# Patient Record
Sex: Male | Born: 1960 | Race: White | Hispanic: No
Health system: Midwestern US, Community
[De-identification: ages and names within clinical notes are randomized; demographics above are authoritative.]

---

## 2015-07-25 NOTE — Other (Unsigned)
Patient Acct Nbr: [id number]   Primary AUTH/CERT:   Primary Insurance Company Name: [employer]  Primary Insurance Plan name: [employer]  Primary Insurance Group Number: [id number]  Primary Insurance Plan Type: Health  Primary Insurance Policy Number: [id number]

## 2015-07-30 LAB — EJECTION FRACTION PERCENTAGE
Left Ventricular Ejection Fraction High Value: 55 %
Left Ventricular Ejection Fraction: 50 %

## 2017-12-08 ENCOUNTER — Ambulatory Visit
Admit: 2017-12-08 | Discharge: 2017-12-08 | Payer: MEDICARE | Attending: Cardiovascular Disease | Primary: Family Medicine

## 2017-12-08 DIAGNOSIS — R55 Syncope and collapse: Secondary | ICD-10-CM

## 2017-12-08 NOTE — Patient Instructions (Signed)
Please call  our office at [phone] if you have questions or if you are having worsening symptoms of chest pain, pressure, or heaviness, aching, tightness or discomfort, worsening shortness of breath, palpitations, lightheadedness, or loss of consciousness. Please seek emergency care or call 911 if symptoms are severe.    Continue current medications as prescribed. If you need medication refills, please call our office during business hours (M-F 8a-4:30p)

## 2017-12-08 NOTE — Progress Notes (Signed)
[facility]  Cardiology / [facility]   OfficeNote    [place]    DATE of SERVICE: 12/08/17  TIME of SERVICE: 10:02 AM                Chief Complaint   Patient presents with   ??? Establish Cardiologist     Syncope , positive orthostatic hypotension X 3-4 years     History of Present Illness:     Joseph Cunningham is a 57 y.o. morbidly obese medically disabled male with a known history of alcohol dependence (3-4 drinks most nights of the week) who presents for evaluation of syncope.  Patient has had previous extensive cardiac evaluation including echocardiography and 2017 which is normal, serial electrocardiograms which are normal, Holter monitoring 2017 which was normal, and tilt table testing which is normal.    On presentation the patient offers no complaints of angina, failure, or palpitation.  He describes syncope which occurs at night when he gets up from bed often without prodrome occasionally with a vagal prodrome of sweating.  His symptoms have been occurring for at least 4 years and none have resulted in injury. 's exam is remarkable for obesity.  His cardiac exam is normal.  He is not orthostatic.    Past Medical History:  Past Medical History:   Diagnosis Date   ??? Alcohol dependence (HCC)    ??? Asthma    ??? Diastolic dysfunction    ??? GERD (gastroesophageal reflux disease)    ??? LONG TERM ANTICOAGULENT USE    ??? Obesity    ??? Opiate dependence (HCC)    ??? Orthostatic hypotension    ??? OSA (obstructive sleep apnea)    ??? Postoperative deep vein thrombosis (DVT) (HCC)    ??? Pulmonary embolism (HCC)    ??? Rheumatoid arthritis (HCC)    ??? Sjogren's syndrome [facility])         Past Surgical History  Past Surgical History:   Procedure Laterality Date   ??? CERVICAL DISCECTOMY  04/2011    and cervical fusion C3-4   ??? CHOLECYSTECTOMY  2003   ??? COLONOSCOPY  2008   ??? HIP ARTHROSCOPY Left 07/10/2016    with labral repair   ??? KIDNEY STONE SURGERY     ??? KNEE ARTHROSCOPY Left    ??? WISDOM TOOTH EXTRACTION        Family History  Family History   Problem Relation Age of Onset   ??? Other Mother         Margarita Mail   ??? Cancer Father         colon   ??? Coronary Art Dis Father    ??? Stroke Father    ??? Alzheimer's Disease Father    ??? Cancer Other         Prostate cancer      Social History  Social History     Tobacco Use   ??? Smoking status: Not on file   Substance Use Topics   ??? Alcohol use: Not on file   ??? Drug use: Not on file      Medications:  Current Outpatient Medications   Medication Sig Dispense Refill   ??? oxyCODONE-acetaminophen (PERCOCET) 5-325 MG per tablet Take 1 tablet by mouth every 6 hours as needed.  0   ??? amitriptyline (ELAVIL) 25 MG tablet Take 25 mg by mouth nightly as needed  0   ??? ferrous sulfate 325 (65 Fe) MG tablet Take 325 mg  by mouth daily (with breakfast)     ??? hydroxychloroquine (PLAQUENIL) 200 MG tablet Take 200 mg by mouth daily     ??? omeprazole (PRILOSEC) 20 MG delayed release capsule Take 20 mg by mouth daily  1   ??? gabapentin (NEURONTIN) 600 MG tablet Take 600 mg by mouth 3 times daily.  1   ??? cevimeline (EVOXAC) 30 MG capsule Take 30 mg by mouth 3 times daily     ??? ondansetron (ZOFRAN) 4 MG tablet Take 4 mg by mouth 2 times daily  1   ??? albuterol sulfate HFA 108 (90 Base) MCG/ACT inhaler Inhale into the lungs     ??? midodrine (PROAMATINE) 10 MG tablet Take 10 mg by mouth 2 times daily  1   ??? venlafaxine (EFFEXOR XR) 150 MG extended release capsule Take 150 mg by mouth daily  0   ??? ELIQUIS 2.5 MG TABS tablet Take 2.5 mg by mouth 2 times daily  1   ??? acetaminophen (TYLENOL) 500 MG tablet Take by mouth as needed     ??? polyethylene glycol (MIRALAX) PACK packet Take by mouth as needed     ??? vitamin C (ASCORBIC ACID) 500 MG tablet Take 500 mg by mouth daily     ??? Multiple Vitamin (MULTI VITAMIN DAILY PO) Take by mouth daily     ??? vitamin D (CHOLECALCIFEROL) 1000 UNIT TABS tablet Take 500 Units by mouth daily       No current facility-administered medications for this visit.      Allergies:  No Known  Allergies  Review of Systems:  Review of Systems   Constitutional: Positive for diaphoresis. Negative for chills and fever.   HENT: Negative for nosebleeds and trouble swallowing.    Respiratory: Negative for cough, shortness of breath and wheezing.    Cardiovascular: Positive for leg swelling (H/o DVT). Negative for chest pain and palpitations (B feet edema).   Gastrointestinal: Negative for blood in stool, nausea and vomiting. Abdominal pain: Pt states it is due to scar tissue post cholecystectomy.   Genitourinary: Negative for hematuria.   Musculoskeletal: Positive for gait problem. Negative for myalgias.   Skin: Negative for rash.   Neurological: Positive for dizziness, syncope (X 3-4 years, positive orthostatic hypotension) and numbness (B feet).   Hematological: Bruises/bleeds easily.   Psychiatric/Behavioral: Negative for dysphoric mood and suicidal ideas.     Physical Examination:  Vitals: Blood pressure 118/80, pulse 94, height 6' 1.5" (1.867 m), weight 265 lb 8 oz (120.4 kg), SpO2 99 %. Body mass index is 34.55 kg/m??.  Physical Exam   Constitutional: He is oriented to person, place, and time. He appears well-developed.   Obese   HENT:   Head: Normocephalic and atraumatic.   Mouth/Throat: No oropharyngeal exudate.   Eyes: Pupils are equal, round, and reactive to light. Conjunctivae are normal. No scleral icterus.   Neck: Normal range of motion. No JVD present. No thyromegaly present.   Cardiovascular: Normal rate, regular rhythm and normal heart sounds. Exam reveals no gallop and no friction rub.   No murmur heard.  Pulmonary/Chest: Effort normal and breath sounds normal. No stridor. No respiratory distress. He has no wheezes. He has no rales.   Abdominal: Soft. Bowel sounds are normal. He exhibits no distension. There is no hepatosplenomegaly or splenomegaly. There is no tenderness.   Musculoskeletal: Normal range of motion. He exhibits no edema or tenderness.   Neurological: He is alert and oriented to  person, place, and time. No  cranial nerve deficit.   Skin: Skin is warm and dry. No rash noted. No erythema.   Psychiatric: He has a normal mood and affect. His behavior is normal.     Cardiac Tests:    ECG:  Normal sinus rhythm and normal electrocardiogram      Lab Results   Component Value Date    LVEF 50 07/30/2015    LVEFMODE Echo 07/30/2015       Assessment and Plan:    Syncope.  Likely alcohol related.  No evidence of structural heart disease, rhythm disturbance, or autonomic dysfunction by recent testing.  I've encouraged patient to abstain from alcohol, and begin a regular aerobic exercise program.  I've asked him to recheck his primary care physician or myself if he is having difficulty, specifically any signs of withdrawal.  We will reevaluate Mr. Hege in 6 months.    Nat Christen, MD [facility]  12/08/17 10:23 AM

## 2019-02-16 ENCOUNTER — Encounter: Admit: 2019-02-16 | Discharge: 2019-02-16 | Payer: MEDICARE | Primary: Family Medicine

## 2019-02-16 DIAGNOSIS — Z1159 Encounter for screening for other viral diseases: Secondary | ICD-10-CM

## 2019-02-16 NOTE — Progress Notes (Signed)
Patient identified by name and date of birth. Patient here today for COVID-19 specimen collection.  Patient provided order for COVID-19 testing.

## 2019-02-17 LAB — COVID-19: SARS-CoV-2: DETECTED — AB

## 2019-10-10 ENCOUNTER — Inpatient Hospital Stay
Admit: 2019-10-10 | Discharge: 2019-10-11 | Disposition: A | Discharge status: Home / Self Care | Attending: Emergency Medicine

## 2019-10-10 DIAGNOSIS — R55 Syncope and collapse: Secondary | ICD-10-CM

## 2019-10-10 LAB — COMPREHENSIVE METABOLIC PANEL
ALT: 35 U/L (ref 0–49)
AST: 34 U/L (ref 15–46)
Albumin,Serum: 4.3 g/dL (ref 3.5–5.0)
Alkaline Phosphatase: 94 U/L (ref 38–126)
Anion Gap: 11 mmol/L (ref 3–13)
BUN: 20 mg/dL (ref 7–20)
CO2: 19 mmol/L — ABNORMAL LOW (ref 22–30)
Calcium: 9.2 mg/dL (ref 8.4–10.4)
Chloride: 108 mmol/L — ABNORMAL HIGH (ref 98–107)
Creatinine: 1.14 mg/dL (ref 0.52–1.25)
EGFR IF NonAfrican American: 69.1 mL/min (ref >60)
Glucose: 107 mg/dL — ABNORMAL HIGH (ref 70–100)
Potassium: 4.1 mmol/L (ref 3.5–5.1)
Sodium: 138 mmol/L (ref 135–145)
Total Bilirubin: 0.3 mg/dL (ref 0.2–1.3)
Total Protein: 8.2 g/dL (ref 6.3–8.2)
eGFR African American: 80.1 mL/min (ref >60)

## 2019-10-10 LAB — TROPONIN: Troponin I: <0.012 ng/mL (ref 0.000–0.034)

## 2019-10-10 LAB — CBC WITH AUTO DIFFERENTIAL
Absolute Baso #: 0 10*3/uL (ref 0.0–0.2)
Absolute Eos #: 0.2 10*3/uL (ref 0.0–0.5)
Absolute Lymph #: 1.6 10*3/uL (ref 1.0–4.3)
Absolute Mono #: 0.6 10*3/uL (ref 0.0–0.8)
Absolute Neut #: 3.3 10*3/uL (ref 1.8–7.0)
Basophils: 0.7 % (ref 0.0–2.0)
Eosinophils: 3.1 % (ref 1.0–6.0)
Granulocytes %: 57.7 % (ref 40.0–80.0)
Hematocrit: 35.9 % — ABNORMAL LOW (ref 40.0–52.0)
Hemoglobin: 12.1 g/dL — ABNORMAL LOW (ref 13.0–18.0)
Lymphocyte %: 28.1 % (ref 20.0–40.0)
MCH: 31.1 pg (ref 26.0–34.0)
MCHC: 33.7 % (ref 32.0–36.0)
MCV: 92.3 fL (ref 80.0–98.0)
MPV: 6.8 fL — ABNORMAL LOW (ref 7.4–10.4)
Monocytes: 10.4 % — ABNORMAL HIGH (ref 2.0–10.0)
Platelets: 172 10*3/uL (ref 140–440)
RBC: 3.89 10*6/uL — ABNORMAL LOW (ref 4.40–5.90)
RDW: 14.7 % — ABNORMAL HIGH (ref 11.5–14.5)
WBC: 5.8 10*3/uL (ref 3.6–10.7)

## 2019-10-10 MED ORDER — NORMAL SALINE FLUSH 0.9 % IV SOLN
0.9 % | Freq: Three times a day (TID) | INTRAVENOUS | Status: DC
Start: 2019-10-10 — End: 2019-10-10

## 2019-10-10 NOTE — ED Notes (Signed)
Pt returned from radiology     Olam Idler, RN  10/10/19 1626

## 2019-10-10 NOTE — Discharge Instructions (Signed)
Return if increasing pain problems or any concerns.    Ice your legs ankles and feet.    Any issues return to the emergency department.

## 2019-10-10 NOTE — ED Notes (Signed)
Pt to radiology     Olam Idler, RN  10/10/19 1626

## 2019-10-10 NOTE — ED Provider Notes (Signed)
[facility] EMERGENCY DEPT  EMERGENCY DEPARTMENT ENCOUNTER      Pt Name: Joseph Cunningham  MRN: [id number]  Birthdate 1960-04-11  Date of evaluation: 10/10/2019  Provider: Margo Aye, DO     CHIEF COMPLAINT       Chief Complaint   Patient presents with   ??? Loss of Consciousness     Pt states that he passed out Sunday @ 12am while standing at the kitchen counter.  States that this has happened in the past but no cause was found after seeing cardiologist.         HISTORY OF PRESENT ILLNESS   (Location/Symptom, Timing/Onset,Context/Setting, Quality, Duration, Modifying Factors, Severity) Note limiting factors.   HPI    Joseph Cunningham is a 59 y.o. male who presents to the emergency department complaining of pain to his lower extremities mainly his left calf and right foot.  He passed out Sunday morning when he is going to the kitchen he says he always passes out.  He is been worked up by cardiology extensively they have not found a reason for this and he presents to the emergency department he is also on Eliquis.  He did hit his head.  He says the pain is a 6 out of 10 on his left leg and foot.  He also has pain in his ankles bilaterally.  He denies any chest pain or shortness of breath.    Nursing Notes were reviewed.    REVIEW OFSYSTEMS    (2+ for level 4; 10+ level 5)   Review of Systems    10 systems reviewed and were negative except mentioned above      PAST MEDICAL HISTORY     Past Medical History:   Diagnosis Date   ??? Alcohol dependence (HCC)    ??? Asthma    ??? Diastolic dysfunction    ??? GERD (gastroesophageal reflux disease)    ??? LONG TERM ANTICOAGULENT USE    ??? Obesity    ??? Opiate dependence (HCC)    ??? Orthostatic hypotension    ??? OSA (obstructive sleep apnea)    ??? Postoperative deep vein thrombosis (DVT) (HCC)    ??? Pulmonary embolism (HCC)    ??? Rheumatoid arthritis (HCC)    ??? Sjogren's syndrome [facility])        SURGICAL HISTORY       Past Surgical History:   Procedure Laterality Date   ??? CERVICAL DISCECTOMY  04/2011     and cervical fusion C3-4   ??? CHOLECYSTECTOMY  2003   ??? COLONOSCOPY  2008   ??? HIP ARTHROSCOPY Left 07/10/2016    with labral repair   ??? KIDNEY STONE SURGERY     ??? KNEE ARTHROSCOPY Left    ??? LUMBAR DISC SURGERY  03/2014    plate, rods, screws- Dr .Hyacinth Meeker   ??? WISDOM TOOTH EXTRACTION             CURRENT MEDICATIONS       Discharge Medication List as of 10/10/2019  8:00 PM      CONTINUE these medications which have NOT CHANGED    Details   oxyCODONE-acetaminophen (PERCOCET) 5-325 MG per tablet Take 1 tablet by mouth every 6 hours as needed., R-0Historical Med      amitriptyline (ELAVIL) 25 MG tablet Take 25 mg by mouth nightly as needed, R-0Historical Med      ferrous sulfate 325 (65 Fe) MG tablet Take 325 mg by mouth daily (with breakfast)Historical Med  hydroxychloroquine (PLAQUENIL) 200 MG tablet Take 200 mg by mouth dailyHistorical Med      omeprazole (PRILOSEC) 20 MG delayed release capsule Take 20 mg by mouth daily, R-1Historical Med      gabapentin (NEURONTIN) 600 MG tablet Take 600 mg by mouth 3 times daily., R-1Historical Med      cevimeline (EVOXAC) 30 MG capsule Take 30 mg by mouth 3 times dailyHistorical Med      ondansetron (ZOFRAN) 4 MG tablet Take 4 mg by mouth 2 times daily, R-1Historical Med      albuterol sulfate HFA 108 (90 Base) MCG/ACT inhaler Inhale into the lungsHistorical Med      midodrine (PROAMATINE) 10 MG tablet Take 10 mg by mouth 2 times daily, R-1Historical Med      venlafaxine (EFFEXOR XR) 150 MG extended release capsule Take 150 mg by mouth daily, R-0Historical Med      ELIQUIS 2.5 MG TABS tablet Take 2.5 mg by mouth 2 times daily, R-1, DAWHistorical Med      acetaminophen (TYLENOL) 500 MG tablet Take by mouth as neededHistorical Med      polyethylene glycol (MIRALAX) PACK packet Take by mouth as neededHistorical Med      vitamin C (ASCORBIC ACID) 500 MG tablet Take 500 mg by mouth dailyHistorical Med      Multiple Vitamin (MULTI VITAMIN DAILY PO) Take by mouth dailyHistorical Med       vitamin D (CHOLECALCIFEROL) 1000 UNIT TABS tablet Take 500 Units by mouth dailyHistorical Med             ALLERGIES     Patient has no known allergies.    FAMILY HISTORY       Family History   Problem Relation Age of Onset   ??? Other Mother         Joseph Cunningham   ??? Cancer Father         colon   ??? Coronary Art Dis Father    ??? Stroke Father    ??? Alzheimer's Disease Father    ??? Cancer Other         Prostate cancer        SOCIAL HISTORY       Social History     Socioeconomic History   ??? Marital status: Divorced     Spouse name: None   ??? Number of children: None   ??? Years of education: None   ??? Highest education level: None   Occupational History   ??? None   Tobacco Use   ??? Smoking status: Never Smoker   ??? Smokeless tobacco: Never Used   Vaping Use   ??? Vaping Use: Never used   Substance and Sexual Activity   ??? Alcohol use: Yes     Comment: 2-3 daily   ??? Drug use: Not Currently   ??? Sexual activity: None   Other Topics Concern   ??? None   Social History Narrative   ??? None     Social Determinants of Health     Financial Resource Strain:    ??? Difficulty of Paying Living Expenses:    Food Insecurity:    ??? Worried About Programme researcher, broadcasting/film/video in the Last Year:    ??? Barista in the Last Year:    Transportation Needs:    ??? Freight forwarder (Medical):    ??? Lack of Transportation (Non-Medical):    Physical Activity:    ??? Days of Exercise per Week:    ???  Minutes of Exercise per Session:    Stress:    ??? Feeling of Stress :    Social Connections:    ??? Frequency of Communication with Friends and Family:    ??? Frequency of Social Gatherings with Friends and Family:    ??? Attends Religious Services:    ??? Database administrator or Organizations:    ??? Attends Engineer, structural:    ??? Marital Status:    Intimate Programme researcher, broadcasting/film/video Violence:    ??? Fear of Current or Ex-Partner:    ??? Emotionally Abused:    ??? Physically Abused:    ??? Sexually Abused:        SCREENINGS           PHYSICAL EXAM    (up to 7 for level 4, 8 ormore for level 5)      ED Triage Vitals [10/10/19 1501]   BP Temp Temp Source Pulse Resp SpO2 Height Weight   (!) 144/86 97.1 ??F (36.2 ??C) Infrared 103 18 98 % -- 265 lb (120.2 kg)       Physical Exam  Constitutional:       Appearance: He is well-developed.   HENT:      Head: Normocephalic and atraumatic.      Right Ear: External ear normal.      Left Ear: External ear normal.      Nose: Nose normal.   Eyes:      Conjunctiva/sclera: Conjunctivae normal.      Pupils: Pupils are equal, round, and reactive to light.   Cardiovascular:      Rate and Rhythm: Normal rate and regular rhythm.      Heart sounds: Normal heart sounds. No murmur heard.     Pulmonary:      Effort: Pulmonary effort is normal. No respiratory distress.      Breath sounds: Normal breath sounds.   Abdominal:      General: Bowel sounds are normal.      Palpations: Abdomen is soft.   Musculoskeletal:         General: Swelling, tenderness and signs of injury present. Normal range of motion.      Cervical back: Normal range of motion.      Comments: Patient has generalized tenderness on his lateral left lower leg.  He is neurovascularly intact.  He also has some generalized tenderness to his ankles bilaterally and the left foot.  He was able to ambulate.  He is neurovascular intact.  Does have some swelling ecchymosis on his lower extremities.   Skin:     General: Skin is warm and dry.      Capillary Refill: Capillary refill takes less than 2 seconds.   Neurological:      Mental Status: He is alert and oriented to person, place, and time.             DIAGNOSTIC RESULTS     EKG (Per Emergency Physician):     EKG shows a rate of 92.  Left axis deviation.  See no obvious ST or depression.  Does have Q waves in 3 and aVF.  See no obvious changes from EKG performed on 06/26/2016  RADIOLOGY (Per Emergency Physician):       Interpretation per the Radiologist below, ifavailable at the time of this note:  XR CHEST (2 VW)    Result Date: 10/10/2019  Patient Name:                GHEE,  RAMI MRN:                         [id number] Acct#:                       [id number] Diagnostic Radiology ACCESSION                 EXAM DATE/TIME           PROCEDURE                 ORDERING PROVIDER [phone]             10/10/2019 16:22 EDT       CR Chest PA & LAT         Denyse Dago CPT code [phone] Reason For Exam (CR Chest PA & LAT) fall Report CLINICAL INFORMATION:  Fall. Frontal and lateral views of the chest were obtained.  No old examinations were available for comparison at the time of dictation. No acute pulmonary disease is noted. There is no pneumothorax. The cardiovascular silhouette is within normal limits.  There are no pleural effusions. The osseous structures are intact. IMPRESSION: No acute pulmonary disease. Report Dictated on Workstation: [id number] ---** Final ---** Dictated: 10/10/2019 4:31 pm Dictating Physician: Stann Mainland Signed Date and Time: 10/10/2019 4:31 pm Signed by: Bevelyn Buckles, ANTHONY Transcribed Date and Time: 10/10/2019 4:31    XR TIBIA FIBULA LEFT (2 VIEWS)    Result Date: 10/10/2019  Patient Name:                BRYLIN, STANISLAWSKI MRN:                         [id number] Acct#:                       [id number] Diagnostic Radiology ACCESSION                 EXAM DATE/TIME           PROCEDURE                 ORDERING PROVIDER [phone]             10/10/2019 16:22 EDT       CR Tibia/Fibula 2 Views   Denyse Dago Left CPT code 09381 Reason For Exam (CR Tibia/Fibula 2 Views Left) trauma PAIN Report LEFT TIBIA AND FIBULA 2 VIEWS CLINICAL INDICATION: trauma PAIN TECHNIQUE: 2 views of the left tibia and fibula. COMPARISON: None. FINDINGS: Serpiginous, linear lucency and slight cortical irregularity noted in the proximal fibular neck. No significant displacement, angulation or apparent dislocation. Soft tissues grossly unremarkable. IMPRESSION: 1. Fracture left fibular neck. BILATERAL FOOT 3 VIEWS CLINICAL INDICATION: trauma PAIN TECHNIQUE: 3 views of the right foot. 3  views of the left foot. COMPARISON: Right ankle radiographs of same date. FINDINGS: Right foot: No acute fracture or dislocation. Probable old/healed fracture deformity and sclerotic change again noted about the distal fibula. Mild hallux valgus and some erosive change in the great toe metatarsal head medial eminence, with overlying soft tissue bunion formation. Degenerative change also in the great toe MTP joint and sesamoid articulation. Very mild plantar calcaneal spurring.  Soft tissues grossly unremarkable.  Diagnostic Radiology Report Left foot: No acute fracture or dislocation. Small, ossific convexity/excrescence off the 5th metatarsal distal-lateral metadiaphysis may represent osteochondroma or exostosis. Joint spaces maintained. Soft tissues grossly unremarkable. IMPRESSION: 1. No acute osseous abnormality. 2. Remote posttraumatic and degenerative change, as reported. Report Dictated on Workstation: [id number] ---** Final ---** Dictated: 10/10/2019 5:13 pm Dictating Physician: Georgetta Haber, MD, WENDELL Signed Date and Time: 10/10/2019 5:21 pm Signed by: Georgetta Haber, MD, [facility] Transcribed Date and Time: 10/10/2019 5:13    XR ANKLE RIGHT (MIN 3 VIEWS)    Result Date: 10/10/2019  Patient Name:                DERRELL, MILANES MRN:                         [id number] Acct#:                       [id number] Diagnostic Radiology ACCESSION                 EXAM DATE/TIME           PROCEDURE                 ORDERING PROVIDER [phone]             10/10/2019 16:22 EDT       CR Ankle 3+ Views Right   Denyse Dago CPT code 82956 Reason For Exam (CR Ankle 3+ Views Right) trauma pain Report Clinical Information: Right ankle pain and swelling following trauma. History of prior ankle fracture without surgery. Right ankle:  AP, oblique and lateral views demonstrate no evidence of acute fracture or dislocation. There is deformity of the distal fibula metaphysis consistent with a remote healed  fracture. There is ossification of the distal tibia fibula synchondrosis. There is narrowing of the lateral aspect of the tibiotalar joint with tilting of the talus and widening of the articulation between the talus and the medial malleolus which could be acute or related to the prior trauma. Well-corticated cortical ossifications of the medial talus could suggest the latter. The ankle mortise appears to be otherwise intact.  The subtalar joint is suboptimally visualized.  The other joints are well-maintained.  The base of the fifth metatarsal is in sufficiently visualized for evaluation.  There is soft tissue swelling about the ankle greatest laterally. Impression: 1. Remote posttraumatic changes of the distal fibula. 2. Narrowing of the lateral aspect of the tibiotalar joint with tilting of the talus with age-indeterminate lateral talar subluxation possibly related to remote trauma. Correlate with clinical parameters. 3. Soft tissue swelling about the ankle greatest laterally. 4. No convincing evidence of other acute bone trauma. Report Dictated on Workstation: [id number] ---** Final ---** Dictated: 10/10/2019 4:28 pm Dictating Physician: Cecille Aver MD, Herbert Deaner Signed Date and Time: 10/10/2019 4:33 pm Signed by: Cecille Aver, MD, [facility] Transcribed Date and Time: 10/10/2019 4:28    CT Head WO Contrast    Result Date: 10/10/2019  Patient Name:                BRAILEN, MACNEAL MRN:                         [id number] Acct#:                       [id number] Computed Tomography ACCESSION  EXAM DATE/TIME           PROCEDURE                 ORDERING PROVIDER [phone]             10/10/2019 16:22 EDT       CT Head or Brain w/o      Denyse Dago Contrast CPT code 90240 Reason For Exam (CT Head or Brain w/o Contrast) fall thinners Report CLINICAL INFORMATION:  Fall with hand and leg injury. Patient is on blood thinners. Unenhanced CT images of the head from skull base to vertex were obtained. Coronal and sagittal  reconstructed CT images of the head were also obtained. Postprocessing 3-D reconstructed CT images of the head were performed and simultaneously reviewed by me on a separate workstation to better evaluate for skull fracture. There were no old examinations available for comparison at the time of dictation. The ventricles and sulci are prominent compatible with atrophy. There is no evidence of hemorrhage, mass or large acute infarct.  There is no mass effect or significant shift of the midline structures.  There are no extraaxial or posterior fossa masses or fluid collections.  No significant abnormalities are noted in the region of the sella turcica. No significant abnormalities of the visualized portions of the orbits are noted. There is a tiny right-sided bony nasal septal spur. There is opacification of a couple left ethmoid air cells. The remainder of the visualized paranasal sinuses and mastoid air cells are clear. No acute displaced skull fracture is visualized. IMPRESSION: 1. No definite evidence of an acute intracranial process. 2. Mild ethmoid sinus disease. Report Dictated on Workstation: [id number] ---** Final ---** Dictated: 10/10/2019 4:31 pm Dictating Physician: Stann Mainland Signed Date and Time: 10/10/2019 4:39 pm Signed by: Bevelyn Buckles, ANTHONY Transcribed Date and Time: 10/10/2019 4:31    XR Foot Standard Bilateral    Result Date: 10/10/2019  Patient Name:                CUTLER, SUNDAY MRN:                         [id number] Acct#:                       [id number] Diagnostic Radiology ACCESSION                 EXAM DATE/TIME           PROCEDURE                 ORDERING PROVIDER [phone]             10/10/2019 16:22 EDT       CR Foot Complete 3+       Denyse Dago Views Bilateral CPT code 19417 Reason For Exam (CR Foot Complete 3+ Views Bilateral) trauma, pain Report LEFT TIBIA AND FIBULA 2 VIEWS CLINICAL INDICATION: trauma PAIN TECHNIQUE: 2 views of the left tibia and fibula. COMPARISON:  None. FINDINGS: Serpiginous, linear lucency and slight cortical irregularity noted in the proximal fibular neck. No significant displacement, angulation or apparent dislocation. Soft tissues grossly unremarkable. IMPRESSION: 1. Fracture left fibular neck. BILATERAL FOOT 3 VIEWS CLINICAL INDICATION: trauma PAIN TECHNIQUE: 3 views of the right foot. 3 views of the left foot. COMPARISON: Right ankle radiographs of same date. FINDINGS: Right foot: No acute fracture or dislocation. Probable old/healed fracture deformity and sclerotic change again noted  about the distal fibula. Mild hallux valgus and some erosive change in the great toe metatarsal head medial eminence, with overlying soft tissue bunion formation. Degenerative change also in the great toe MTP joint and sesamoid articulation. Very mild plantar calcaneal spurring.  Soft tissues grossly unremarkable.                                          Diagnostic Radiology Report Left foot: No acute fracture or dislocation. Small, ossific convexity/excrescence off the 5th metatarsal distal-lateral metadiaphysis may represent osteochondroma or exostosis. Joint spaces maintained. Soft tissues grossly unremarkable. IMPRESSION: 1. No acute osseous abnormality. 2. Remote posttraumatic and degenerative change, as reported. Report Dictated on Workstation: [id number] ---** Final ---** Dictated: 10/10/2019 5:13 pm Dictating Physician: Georgetta Haber, MD, WENDELL Signed Date and Time: 10/10/2019 5:21 pm Signed by: Georgetta Haber, MD, [facility] Transcribed Date and Time: 10/10/2019 5:13      ED BEDSIDE ULTRASOUND:   Performed by ED Physician - none    LABS:  Labs Reviewed   COMPREHENSIVE METABOLIC PANEL - Abnormal; Notable for the following components:       Result Value    Chloride 108 (*)     CO2 19 (*)     Glucose 107 (*)     All other components within normal limits    Narrative:     Test Performed by [facility], [address] [place], [place]  [address]   CBC WITH AUTO DIFFERENTIAL -  Abnormal; Notable for the following components:    RBC 3.89 (*)     Hemoglobin 12.1 (*)     Hematocrit 35.9 (*)     RDW 14.7 (*)     MPV 6.8 (*)     Monocytes 10.4 (*)     All other components within normal limits    Narrative:     Test Performed by [facility], [address] [place], [place]  [address]   TROPONIN    Narrative:     Test Performed by [facility], [address], [place], [place]  [address]        All other labs were within normal range or not returned as of this dictation.    EMERGENCY DEPARTMENT COURSE and DIFFERENTIALDIAGNOSIS/MDM:   Vitals:    Vitals:    10/10/19 1501   BP: (!) 144/86   Pulse: 103   Resp: 18   Temp: 97.1 ??F (36.2 ??C)   TempSrc: Infrared   SpO2: 98%   Weight: 120.2 kg (265 lb)       Medications   sodium chloride flush 0.9 % injection 3 mL (has no administration in time range)       MDM.    History and physical was obtained.  Patient had blood work which was unremarkable.  His x-ray showed no obvious fractures.  His CT of the brain showed no obvious bleeding.  Chest x-ray is unremarkable.  He feels well at this time.  We will discharge him to home.    REVAL:         CRITICAL CARE TIME   Total Critical Care time was     minutes, excluding separatelyreportable procedures.  There was a high probability ofclinically significant/life threatening deterioration in the patient's condition which required my urgent intervention.     CONSULTS:  None    PROCEDURES:  Unless otherwise noted below, none  Procedures    IMPRESSION      1. Syncope and collapse    2. Closed head injury, initial encounter    3. Contusion of left foot, initial encounter    4. Contusion of right ankle, initial encounter    5. Contusion of multiple sites of left lower extremity, initial encounter          DISPOSITION/PLAN   DISPOSITION        PATIENT REFERRED TO:  Jillyn LedgerLinda J Keith, DO  [address], [address]  [place] [id number]  [phone]    In 3 days        DISCHARGE MEDICATIONS:  Discharge Medication  List as of 10/10/2019  8:00 PM             (Please note:  Portions of this note were completed with a voice recognition program.  Efforts were made to edit thedictations but occasionally words and phrases are mis-transcribed.)  Form [id number]    Margo AyeWilliam E Roshaunda Starkey, DO (electronically signed)  Emergency Medicine Provider       Margo AyeWilliam E Kattia Selley, DO  10/10/19 2105

## 2020-06-15 IMAGING — DX WRIST LEFT 3 VIEWS
1 series · 3 of 3 positions shown · non-contrast
Comparison: None.

UNIT 101 
WRIST RIGHT 3 VIEWS, HAND 3 VIEWS RIGHT, HAND 3 VIEWS LEFT, WRIST LEFT 3 VIEWS, 
06/15/2020 [DATE]: 
CLINICAL INDICATION:  Rheumatoid arthritis with rheumatoid factor.

[Series 1: lateral · 0.14mm/px · 3 of 3 slices shown]
[im 1/3]
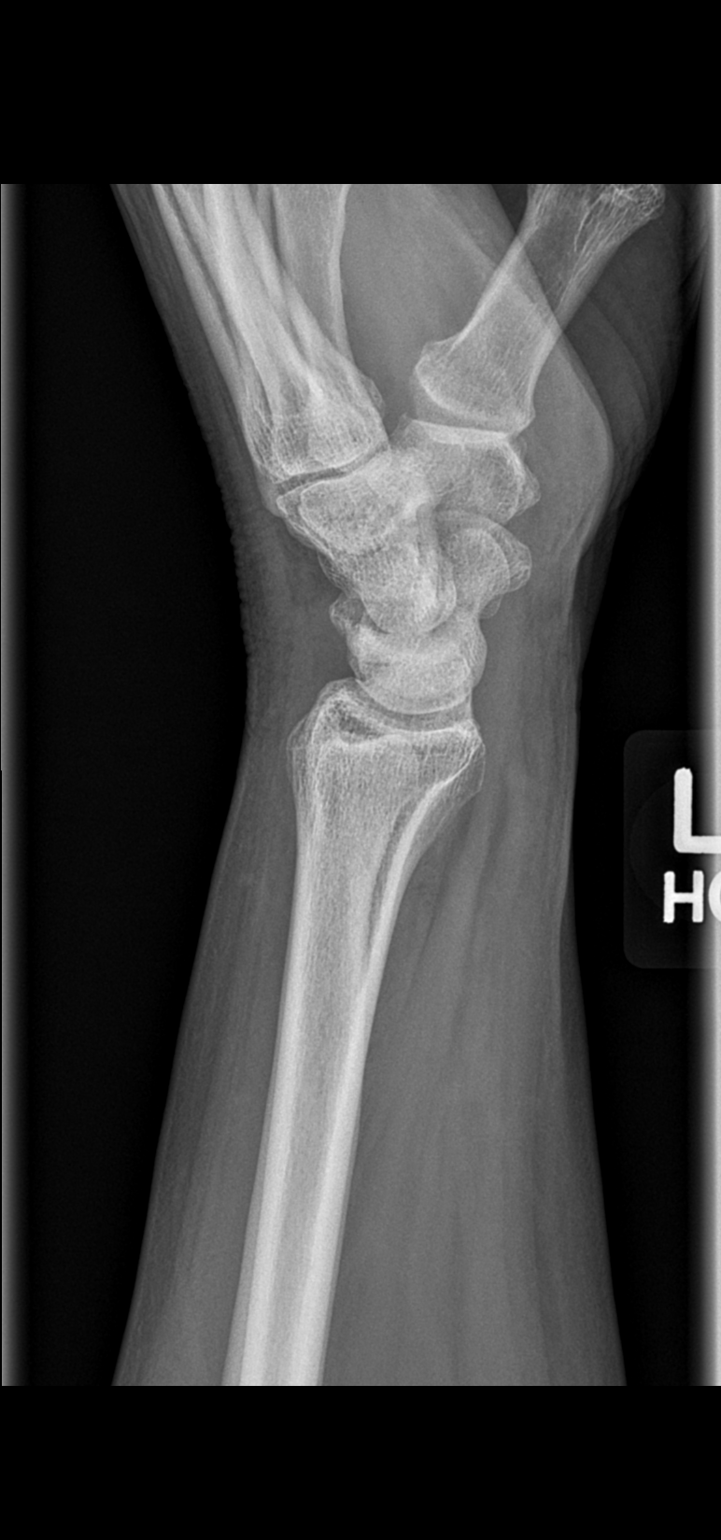
[im 2/3]
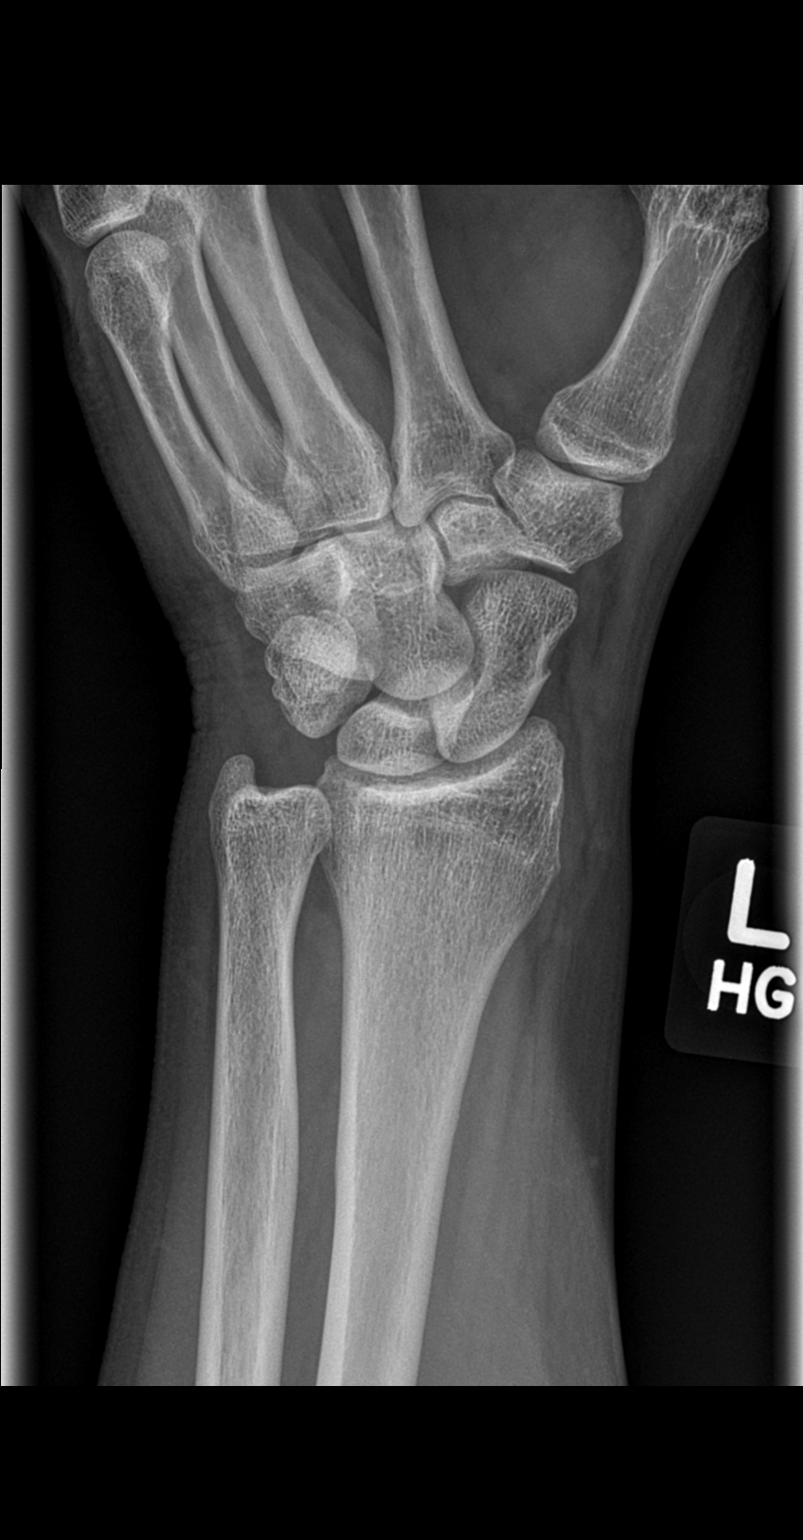
[im 3/3]
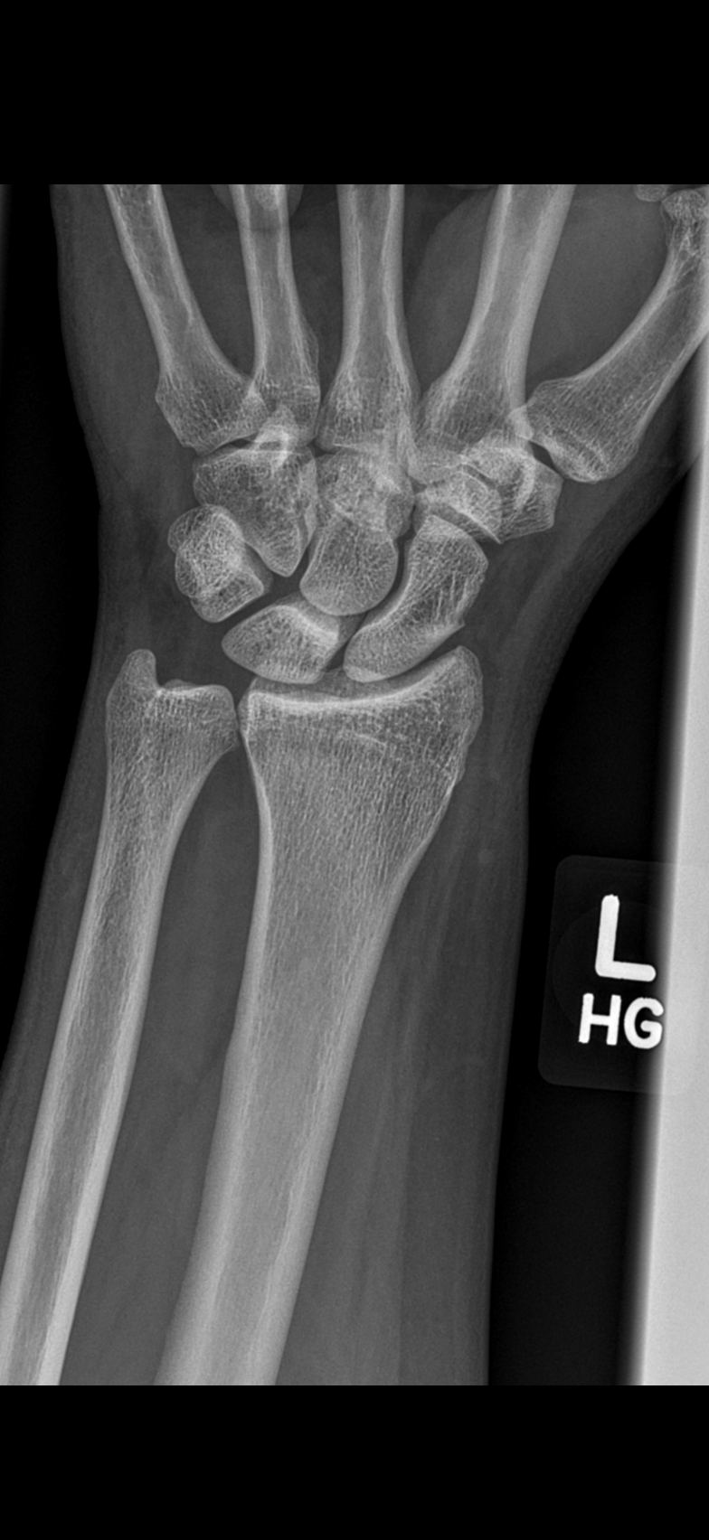

[3 of 3 positions shown; findings below may reference images not displayed]

FINDINGS: No erosions. Mild degenerative change of several interphalangeal (IP) 
joints, most marked involving the thumb. No focal soft tissue swelling.
IMPRESSION: 1.  No erosions. 
2.  Mild degenerative change.

## 2020-06-15 IMAGING — DX HAND 3 VIEWS RIGHT
1 series · 3 of 3 positions shown · non-contrast
Comparison: None.

UNIT 101 
WRIST RIGHT 3 VIEWS, HAND 3 VIEWS RIGHT, HAND 3 VIEWS LEFT, WRIST LEFT 3 VIEWS, 
06/15/2020 [DATE]: 
CLINICAL INDICATION:  Rheumatoid arthritis with rheumatoid factor.

[Series 1: AP · 0.14mm/px · 3 of 3 slices shown]
[im 1/3]
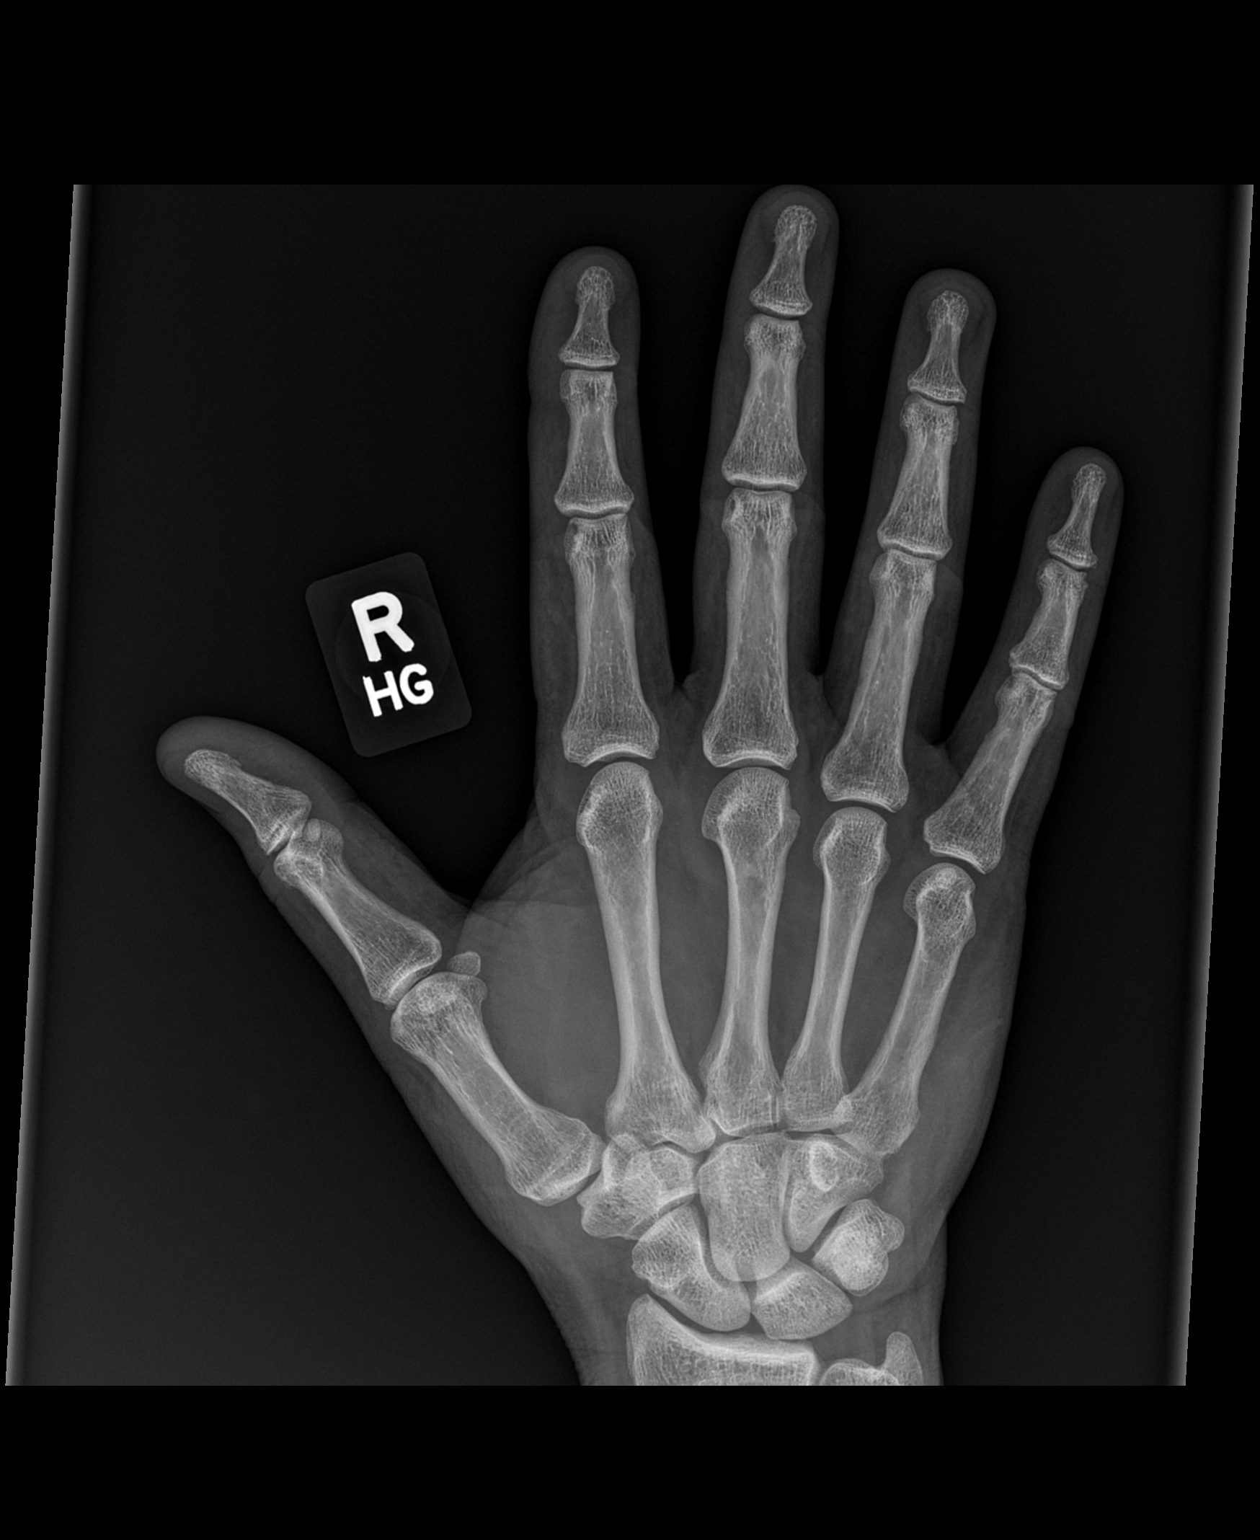
[im 2/3]
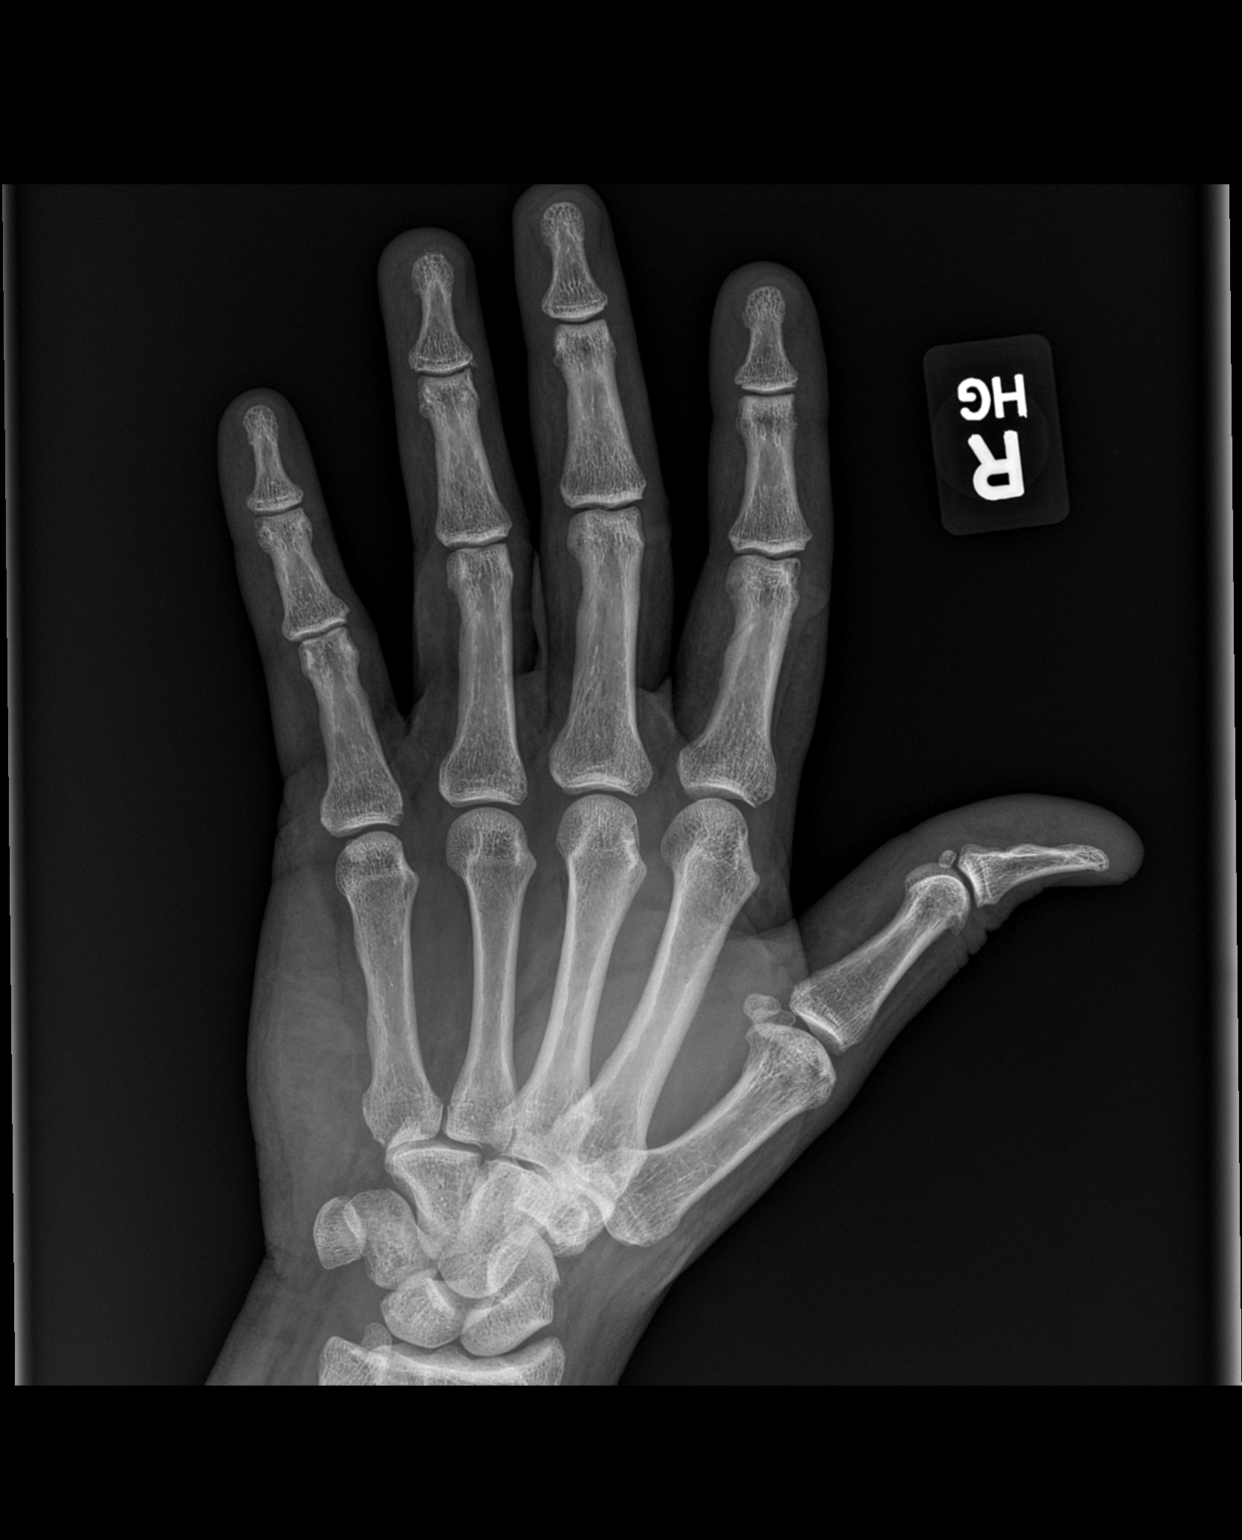
[im 3/3]
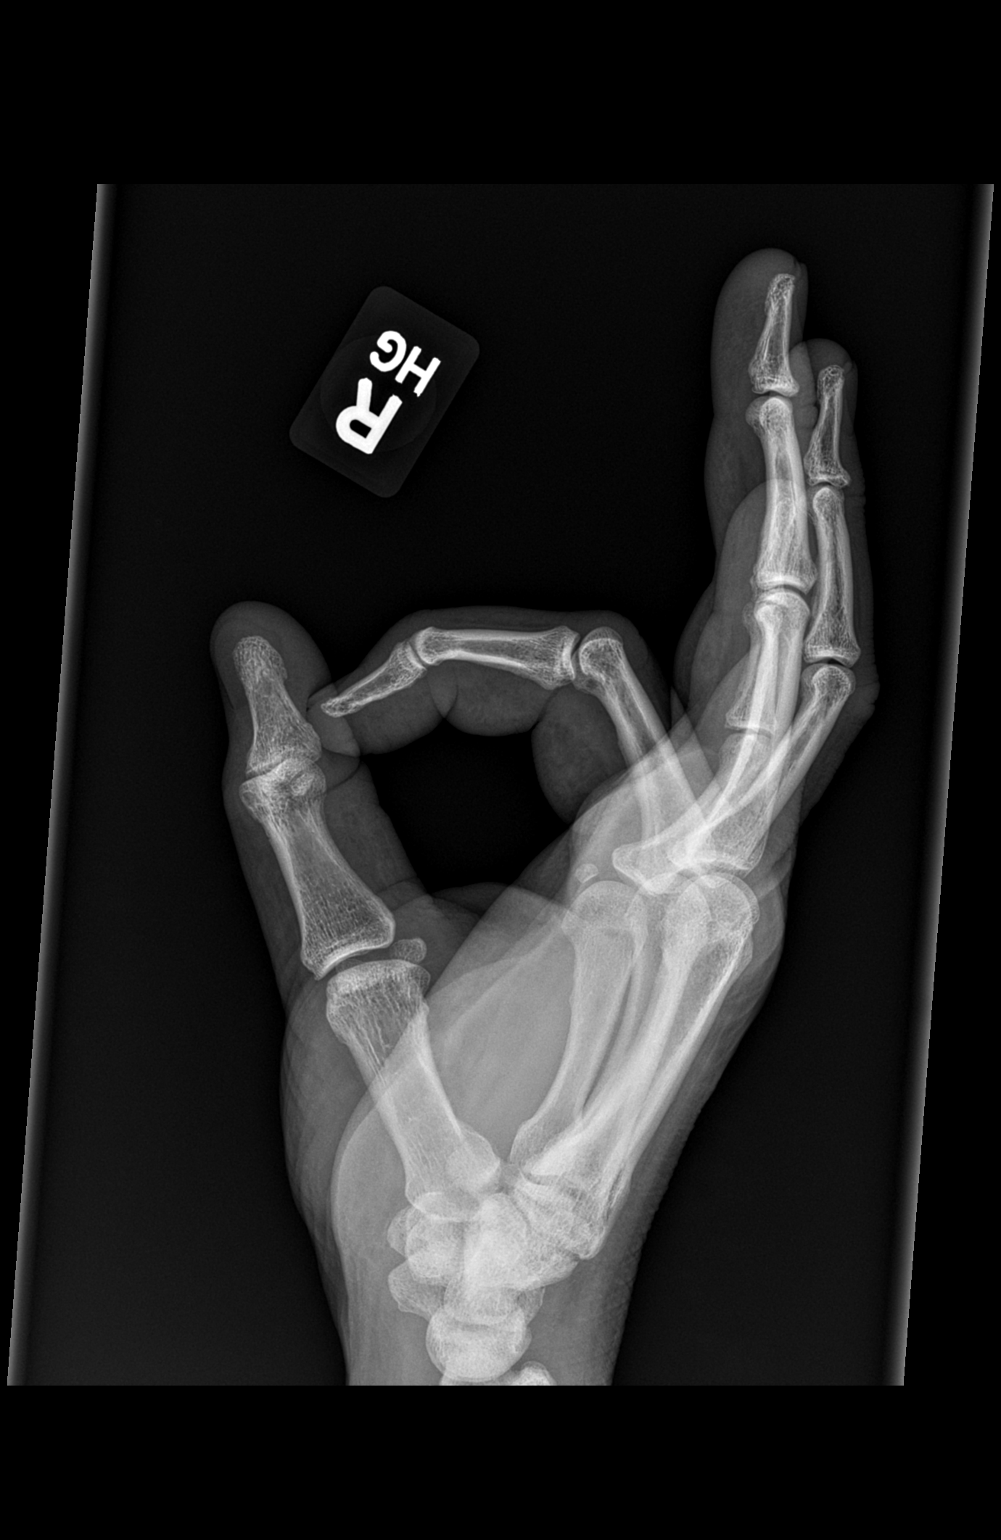

[3 of 3 positions shown; findings below may reference images not displayed]

FINDINGS: No erosions. Mild degenerative change of several interphalangeal (IP) 
joints, most marked involving the thumb. No focal soft tissue swelling.
IMPRESSION: 1.  No erosions. 
2.  Mild degenerative change.

## 2020-06-15 IMAGING — DX HAND 3 VIEWS LEFT
1 series · 3 of 3 positions shown · non-contrast
Comparison: None.

UNIT 101 
WRIST RIGHT 3 VIEWS, HAND 3 VIEWS RIGHT, HAND 3 VIEWS LEFT, WRIST LEFT 3 VIEWS, 
06/15/2020 [DATE]: 
CLINICAL INDICATION:  Rheumatoid arthritis with rheumatoid factor.

[Series 1: AP · 0.14mm/px · 3 of 3 slices shown]
[im 1/3]
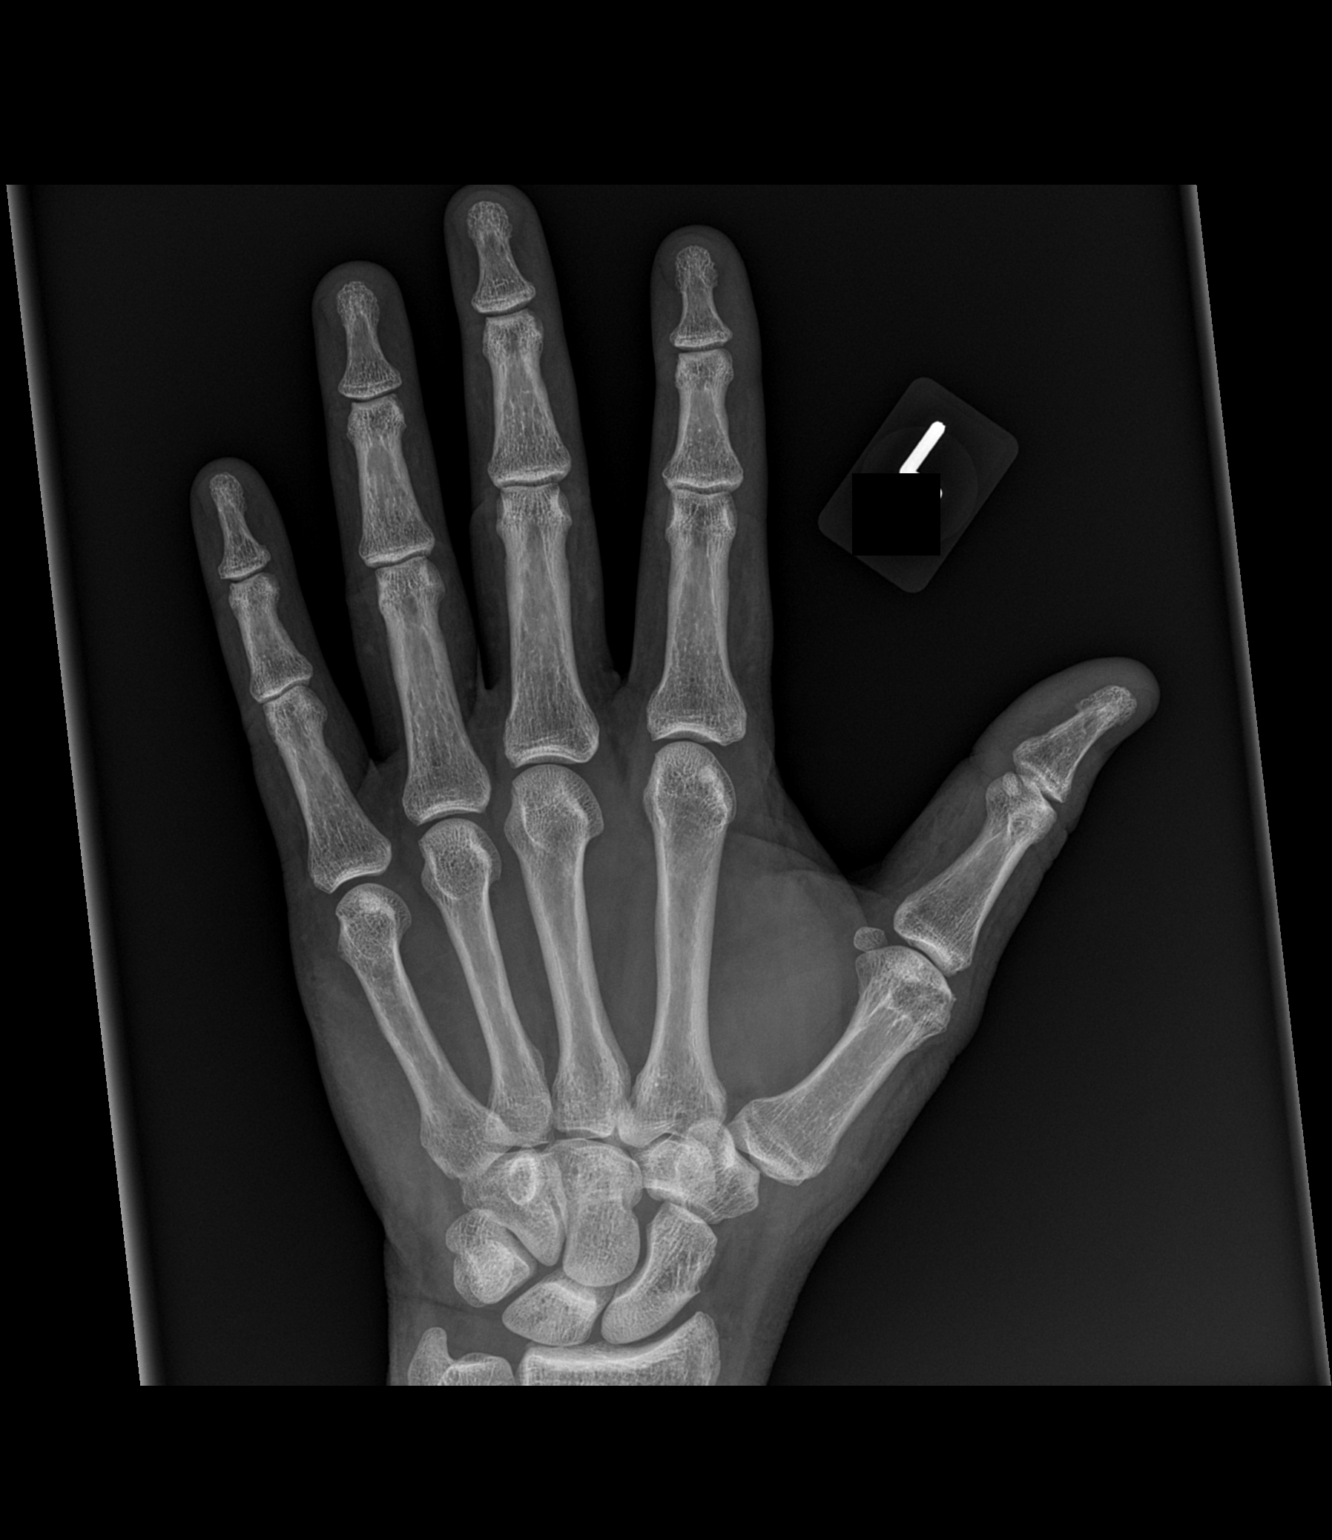
[im 2/3]
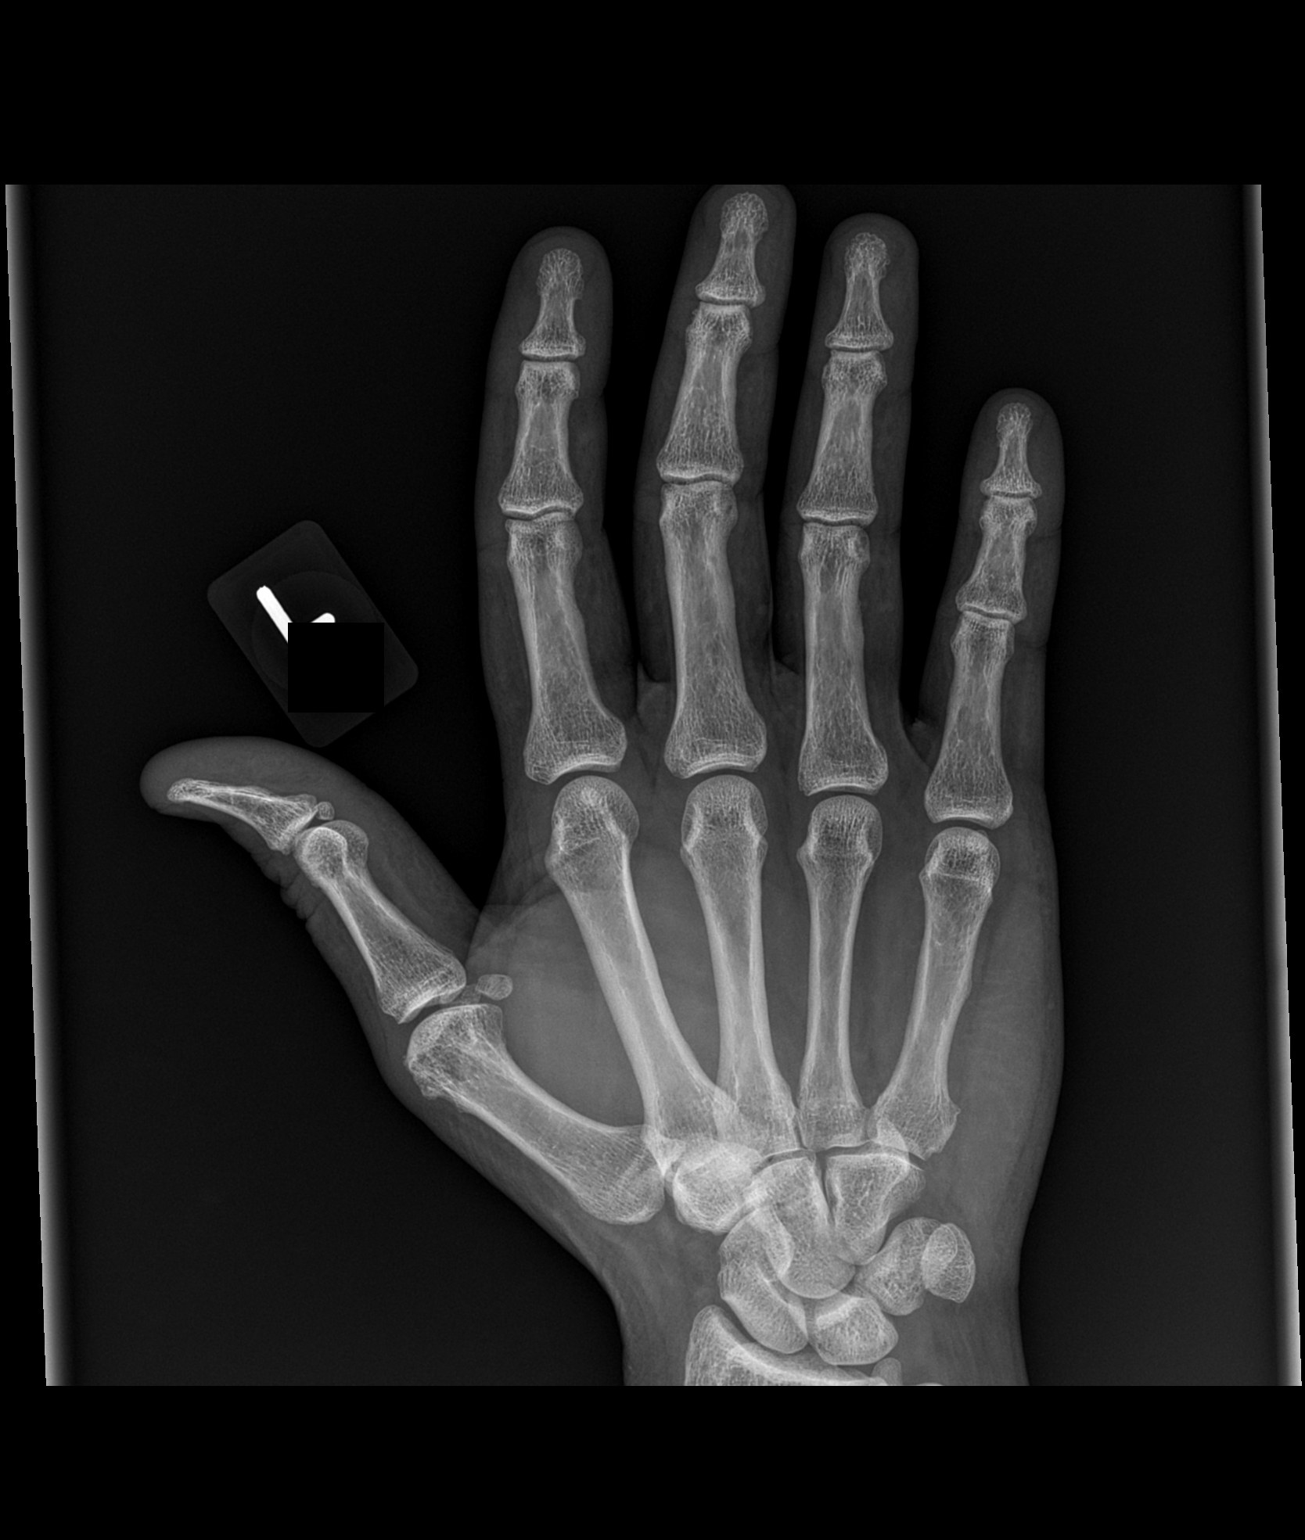
[im 3/3]
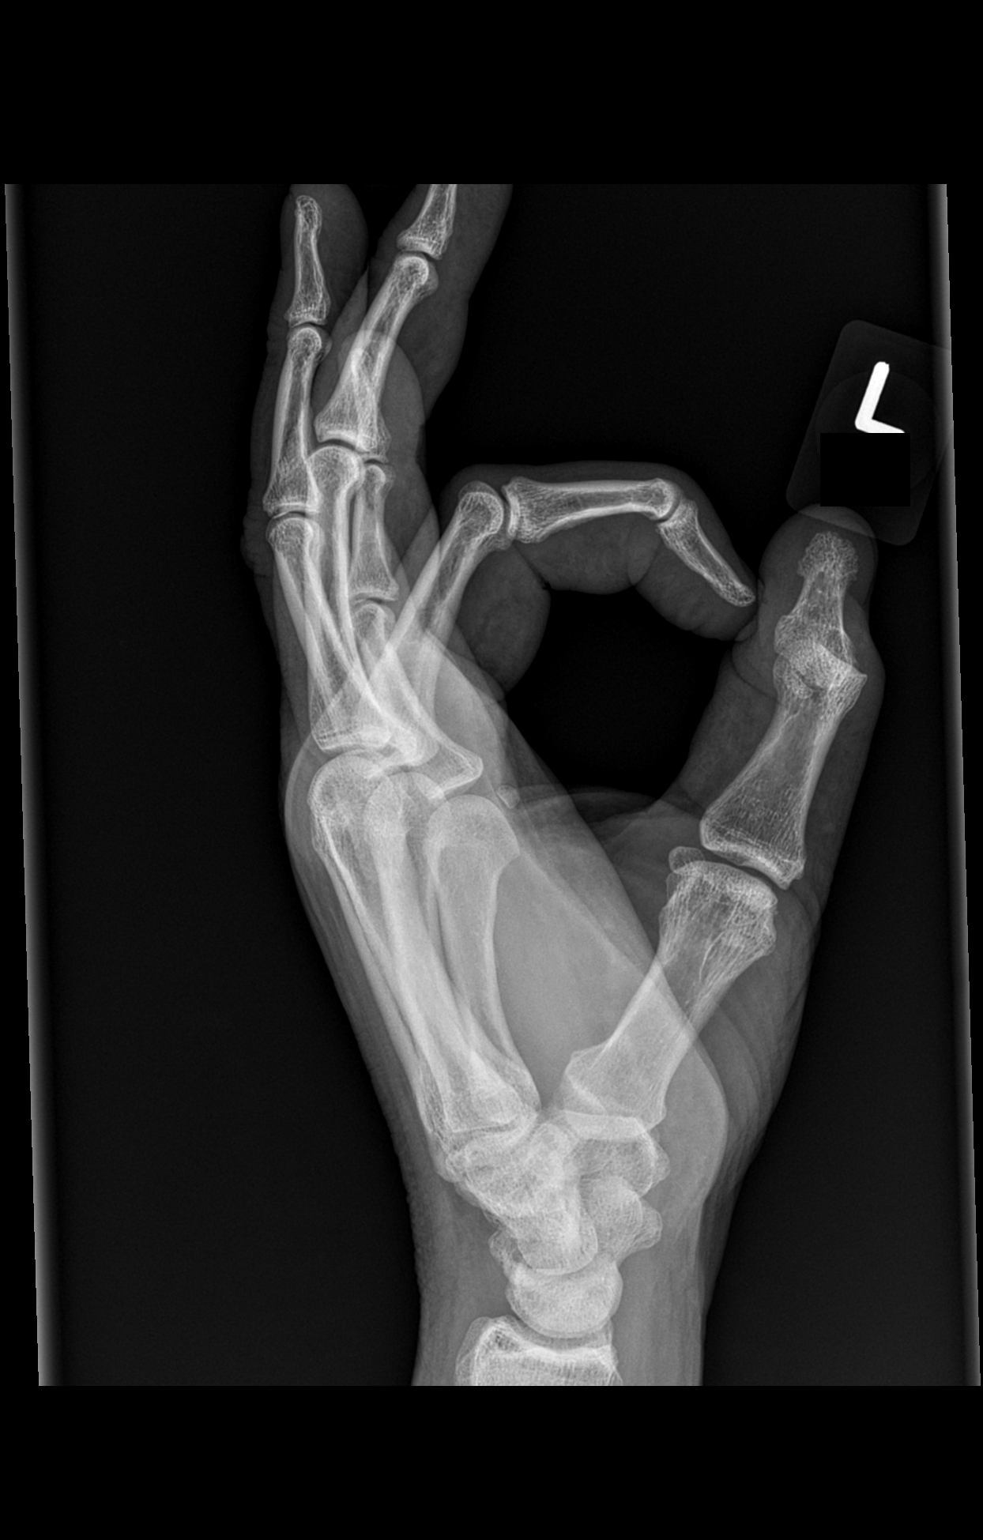

[3 of 3 positions shown; findings below may reference images not displayed]

FINDINGS: No erosions. Mild degenerative change of several interphalangeal (IP) 
joints, most marked involving the thumb. No focal soft tissue swelling.
IMPRESSION: 1.  No erosions. 
2.  Mild degenerative change.

## 2020-06-15 IMAGING — DX WRIST RIGHT 3 VIEWS
1 series · 3 of 3 positions shown · non-contrast
Comparison: None.

UNIT 101 
WRIST RIGHT 3 VIEWS, HAND 3 VIEWS RIGHT, HAND 3 VIEWS LEFT, WRIST LEFT 3 VIEWS, 
06/15/2020 [DATE]: 
CLINICAL INDICATION:  Rheumatoid arthritis with rheumatoid factor.

[Series 1: lateral · 0.14mm/px · 3 of 3 slices shown]
[im 1/3]
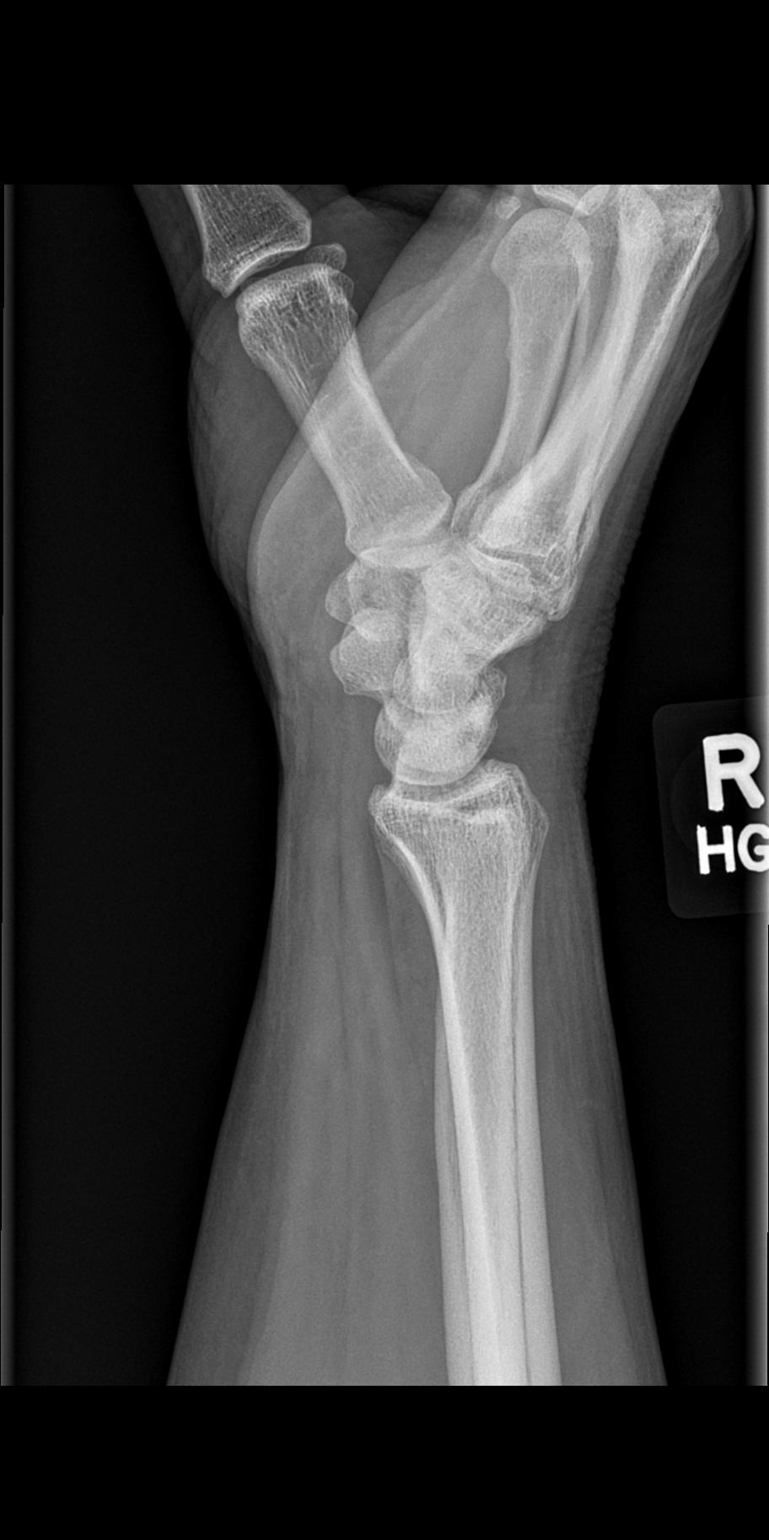
[im 2/3]
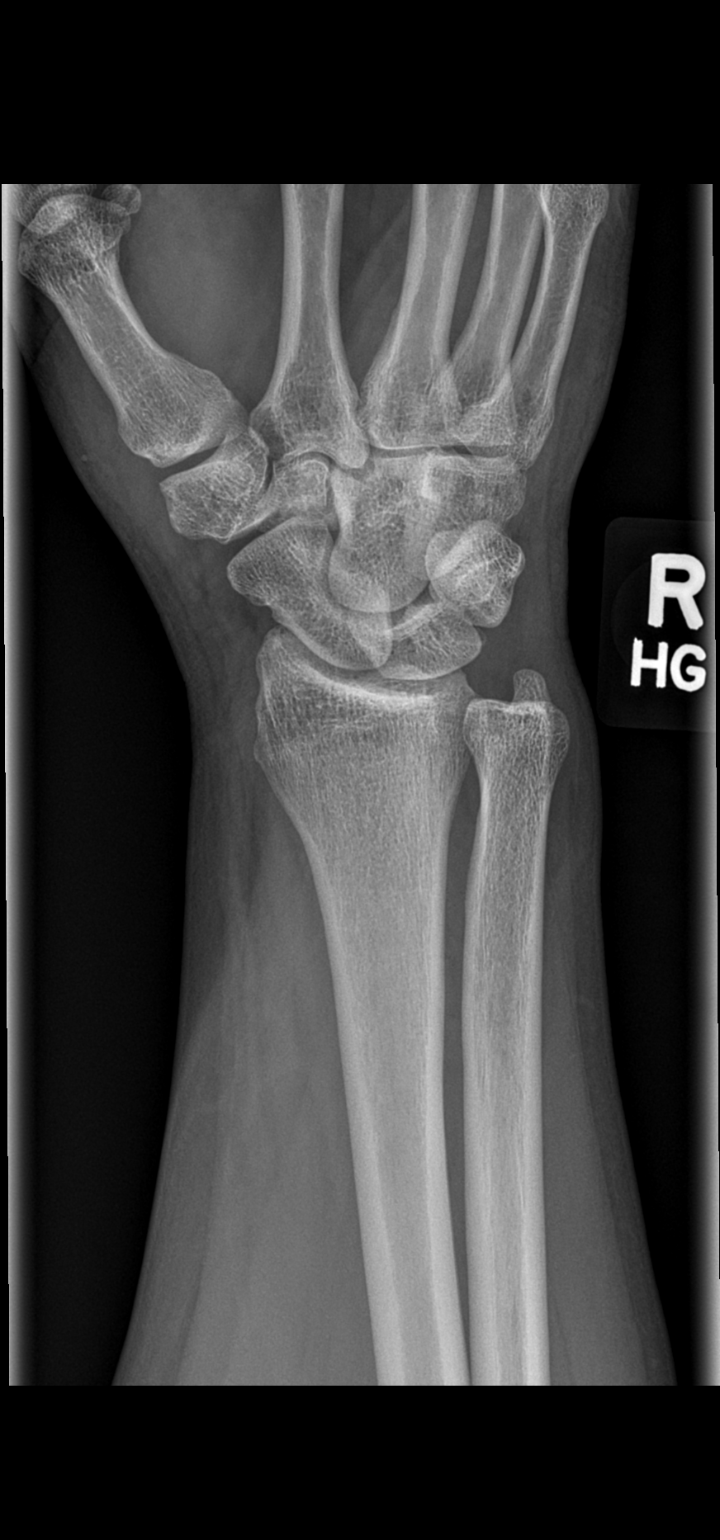
[im 3/3]
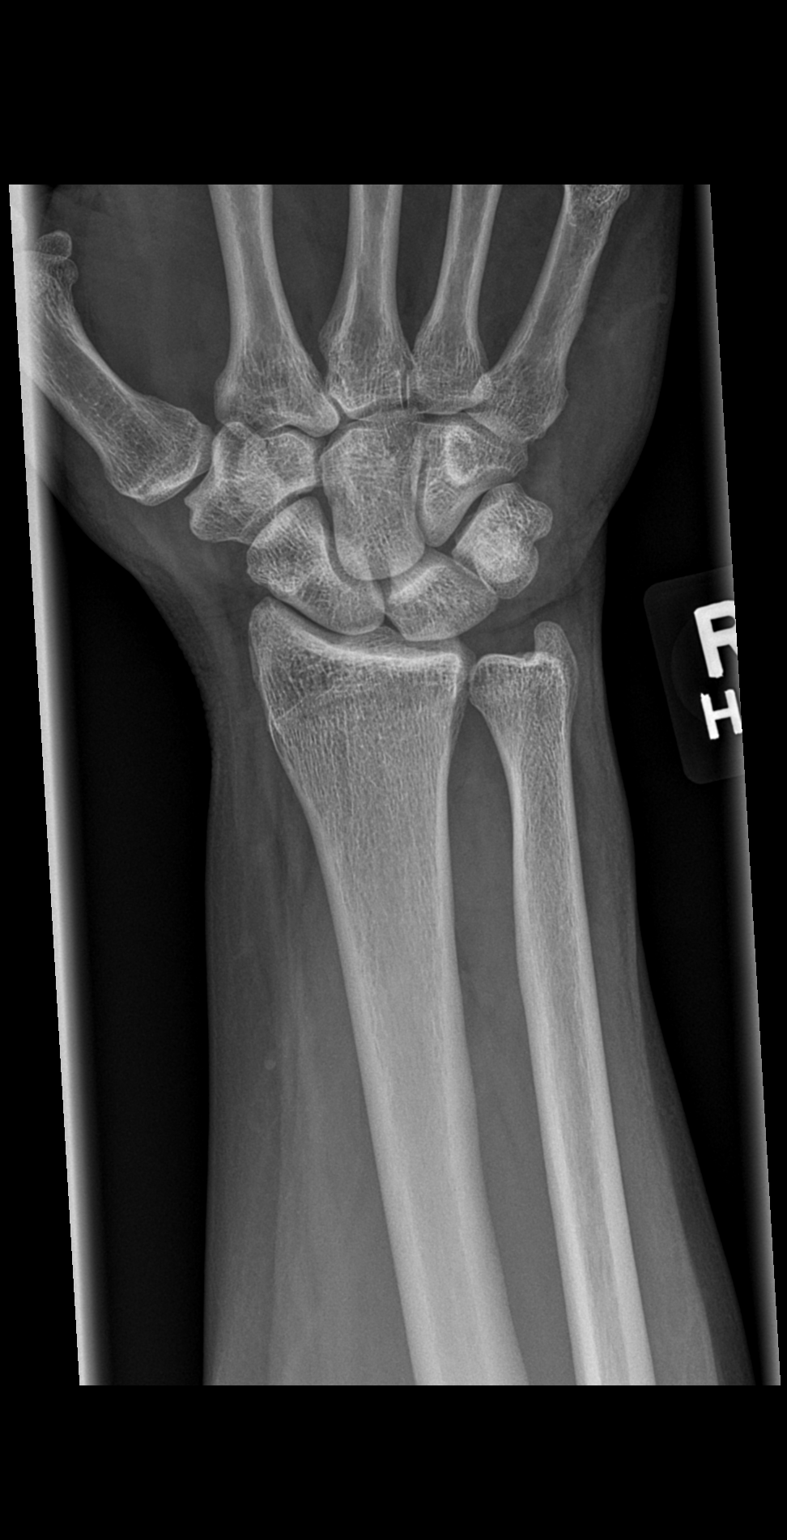

[3 of 3 positions shown; findings below may reference images not displayed]

FINDINGS: No erosions. Mild degenerative change of several interphalangeal (IP) 
joints, most marked involving the thumb. No focal soft tissue swelling.
IMPRESSION: 1.  No erosions. 
2.  Mild degenerative change.

## 2021-10-02 IMAGING — DX CERVICAL SPINE 3 VIEWS
1 series · 3 of 3 positions shown · non-contrast
Comparison: None

________________________________________________________________________________________________ 
CERVICAL SPINE 3 VIEWS, 10/02/2021 [DATE]: 
CLINICAL INDICATION: Neck pain, chronic cervical pain, status post fall 
headfirst into drywall one week ago, remote history of cervical fusion

[Series 1: lateral · 0.14mm/px · 3 of 3 slices shown]
[im 1/3]
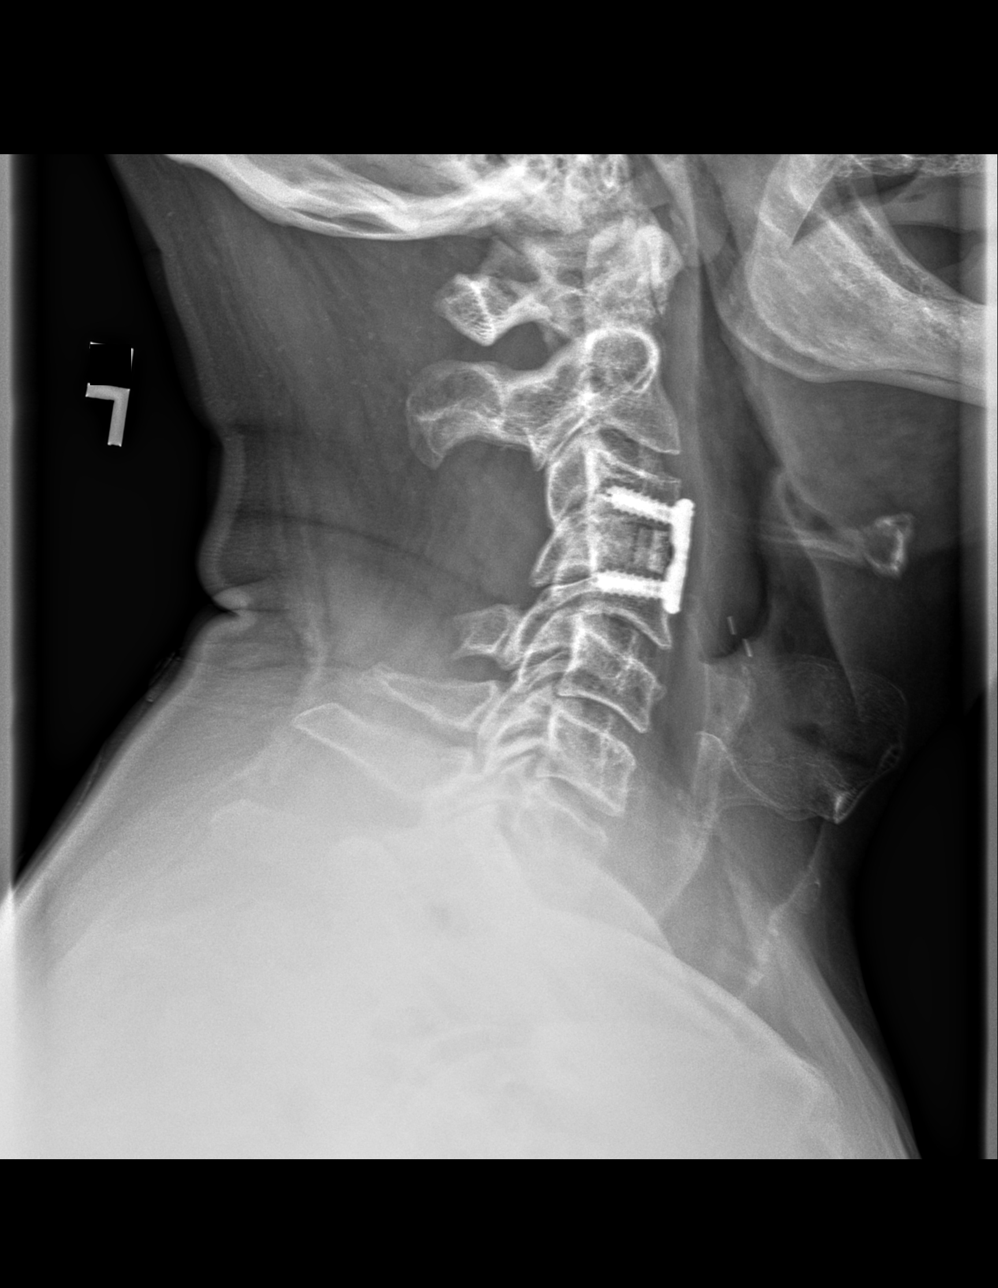
[im 2/3]
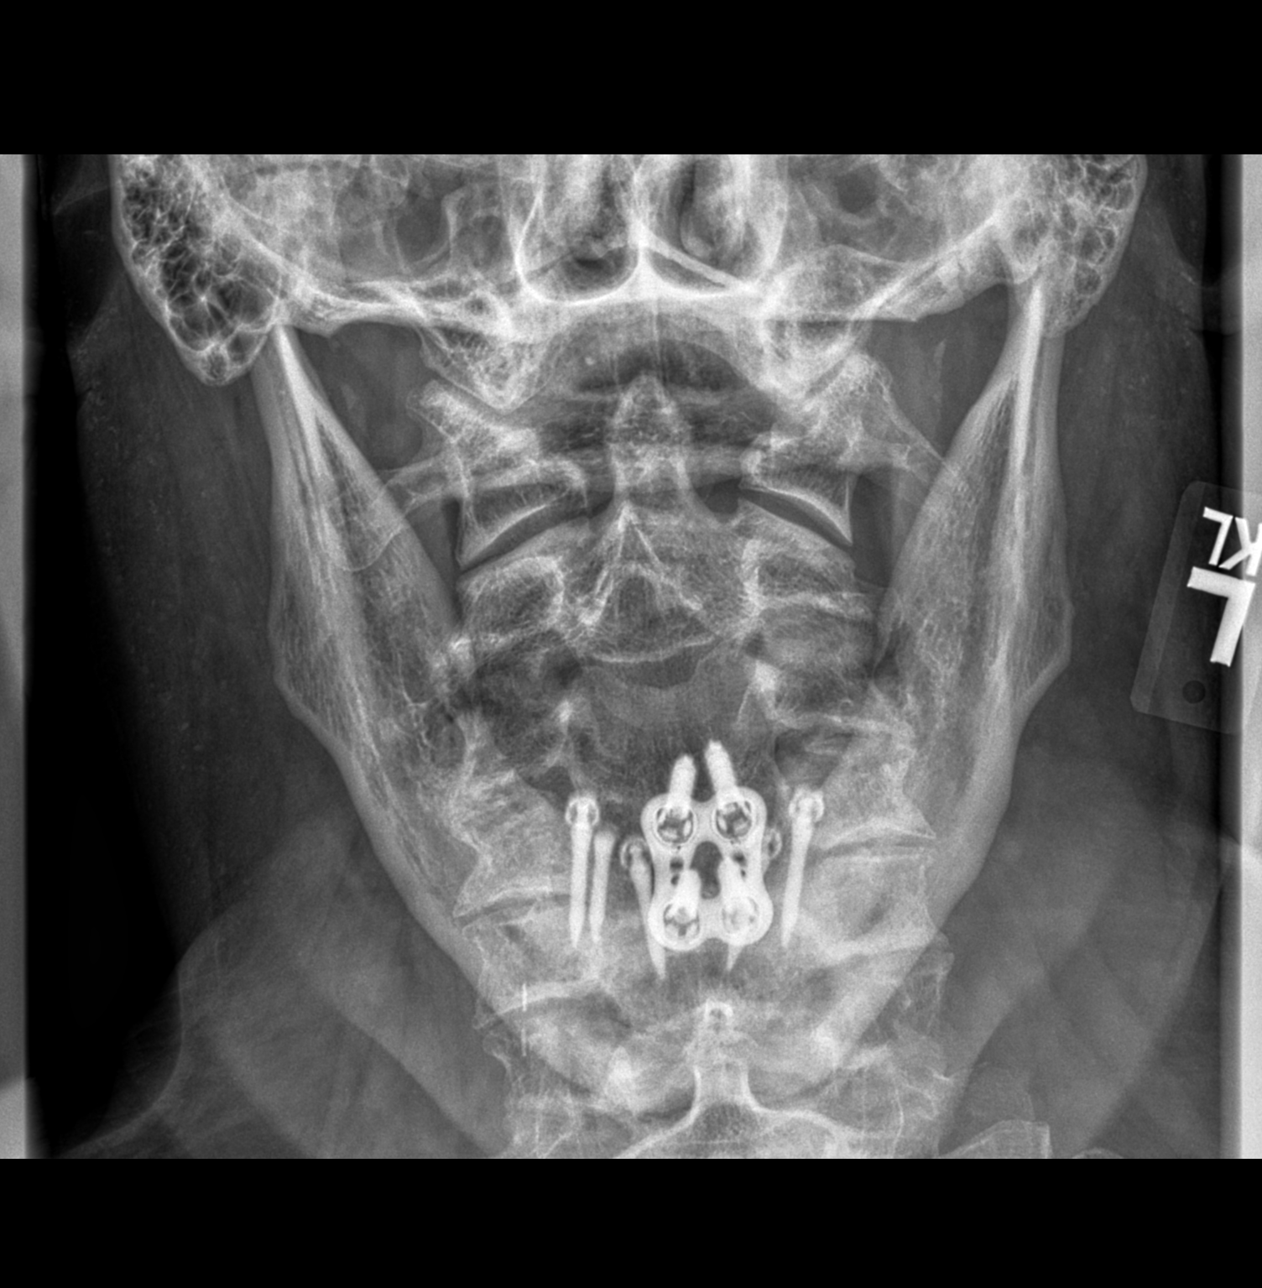
[im 3/3]
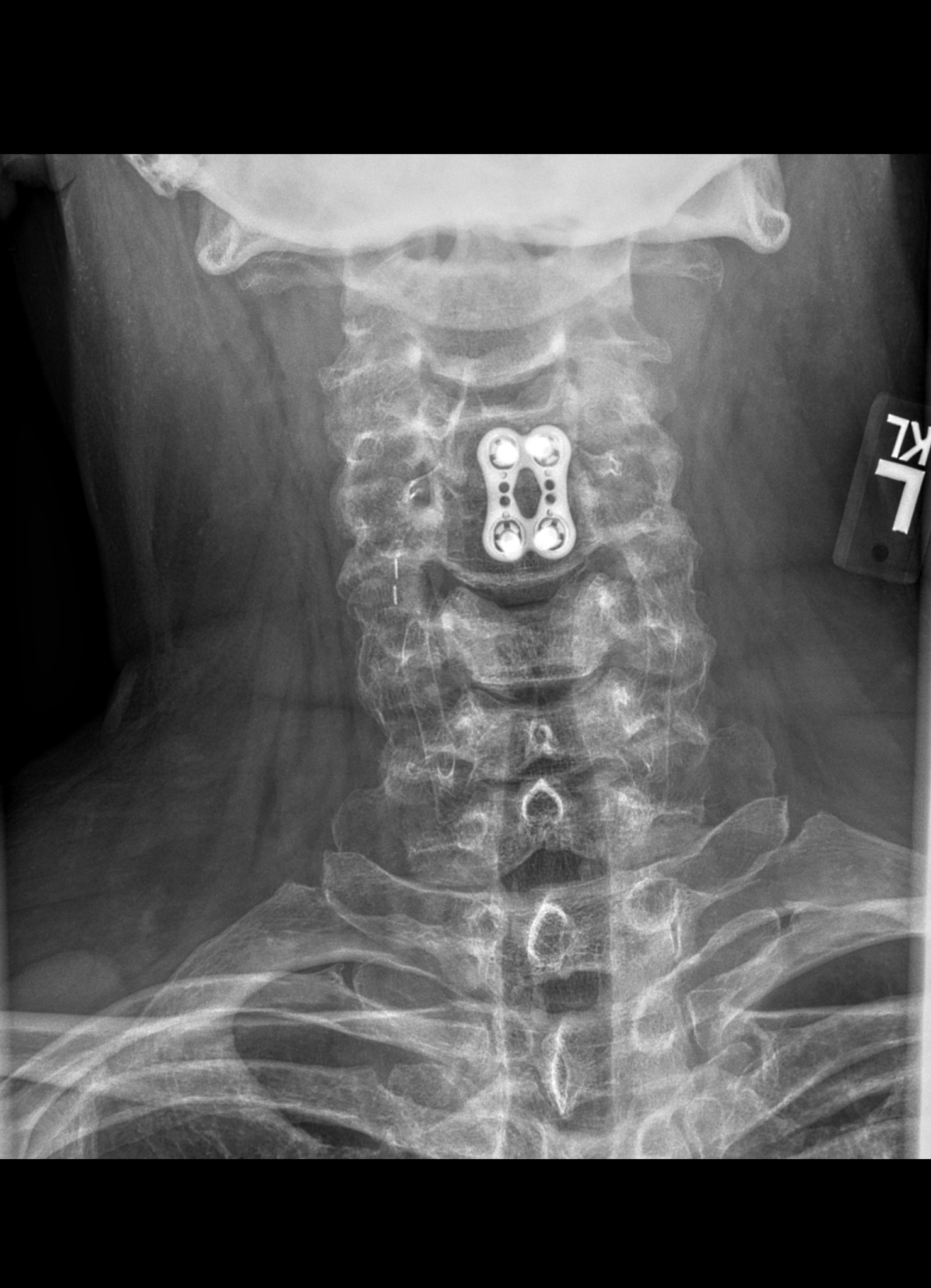

[3 of 3 positions shown; findings below may reference images not displayed]

FINDINGS: Previous C3/C4 anterior cervical plating with intervertebral disc 
spacer and posterior decompression. Wedging C5 superior endplate, chronicity is 
unknown. Truncated C5 spinous process may be surgical. Imaged lung apices are 
clear. C1 lateral masses align normally on C2. Base the odontoid is intact. 
Clips in the RIGHT neck. Edentulous patient.
IMPRESSION: Previous C3/C4 cervical plating with intervertebral disc spacer and laminectomy. 
Fusion appears solid. Truncated C5 spinous process, presumably postoperative. 
Mild wedging superior C5 endplate, chronicity is unknown. If this is not a known 
finding, recommend MR cervical spine.

## 2021-12-18 IMAGING — MR MRI LUMBAR SPINE WITHOUT CONTRAST
6 of 9 series · 13 of 48 positions shown · IV contrast (gadolinium)
Comparison: Radiographs the same day.

________________________________________________________________________________________________ 
MRI LUMBAR SPINE WITHOUT CONTRAST, 12/18/2021 [DATE]: 
CLINICAL INDICATION: Back pain lumbar region. Chronic low back pain with 
increasing neuropathic pain since surgery in [id number].
TECHNIQUE: Multiplanar, multiecho position MR images of the lumbar spine were 
performed without intravenous gadolinium enhancement. Patient was scanned on a 
1.5T magnet.

[Series 101: survey · axial · 10.0mm · 1.25mm/px · 1 of 10 slices shown]
[im 1/10]
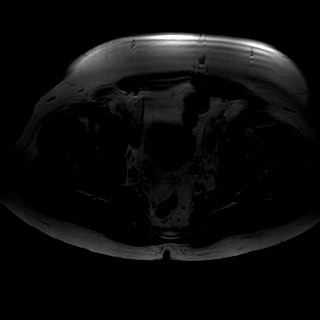

[Series 201: t2w_cor-surv · coronal · 6.0mm · 0.62mm/px · 2 of 14 slices shown]
[im 1/14]
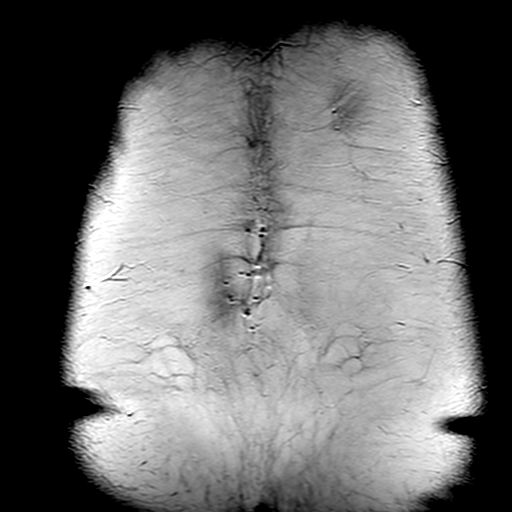
[im 14/14]
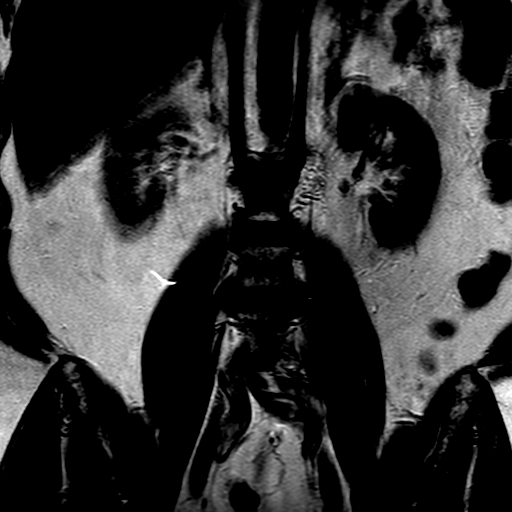

[Series 301: t1_tse_sag · sagittal · 4.0mm · 0.48mm/px · 3 of 19 slices shown]
[im 1/19]
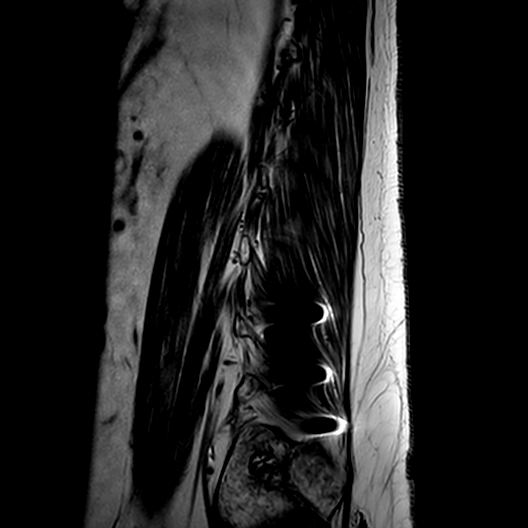
[im 10/19]
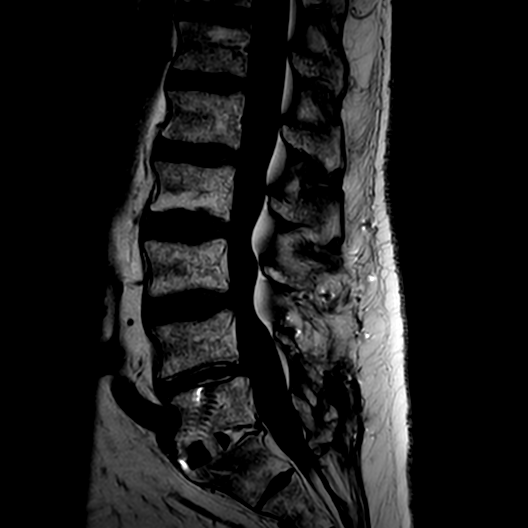
[im 19/19]
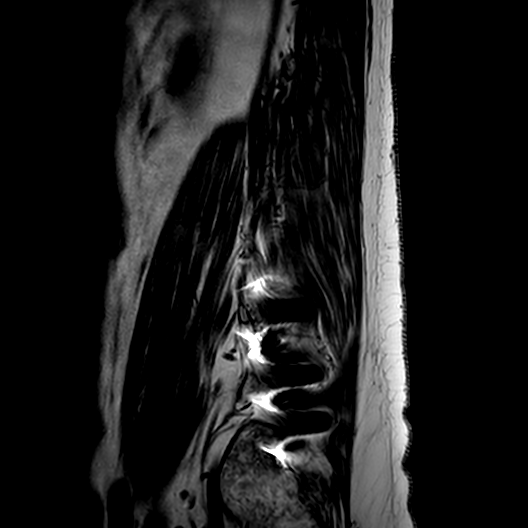

[Series 402: (id)_mdixon_tse · sagittal · 4.0mm · 0.40mm/px · 3 of 19 slices shown]
[im 1/19]
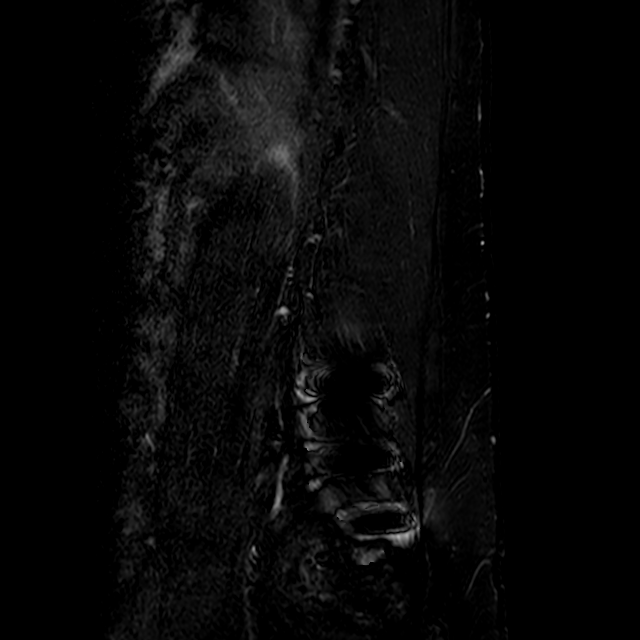
[im 10/19]
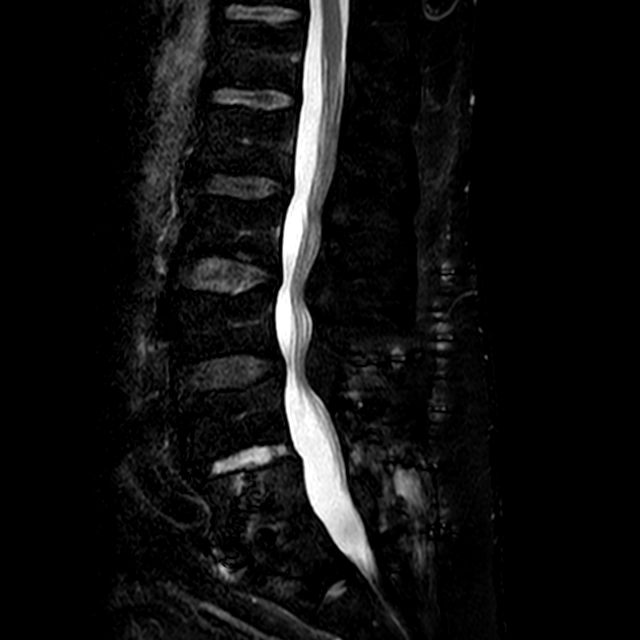
[im 19/19]
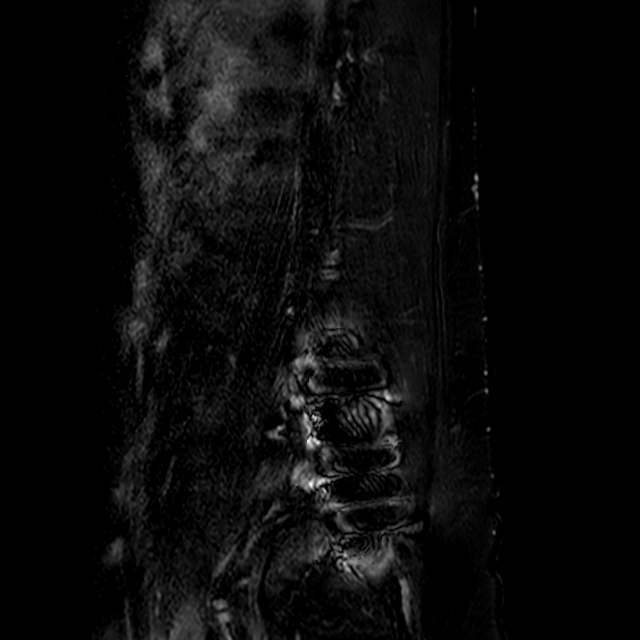

[Series 403: st2w_mdixon_tse · sagittal · 4.0mm · 0.40mm/px · 3 of 19 slices shown]
[im 1/19]
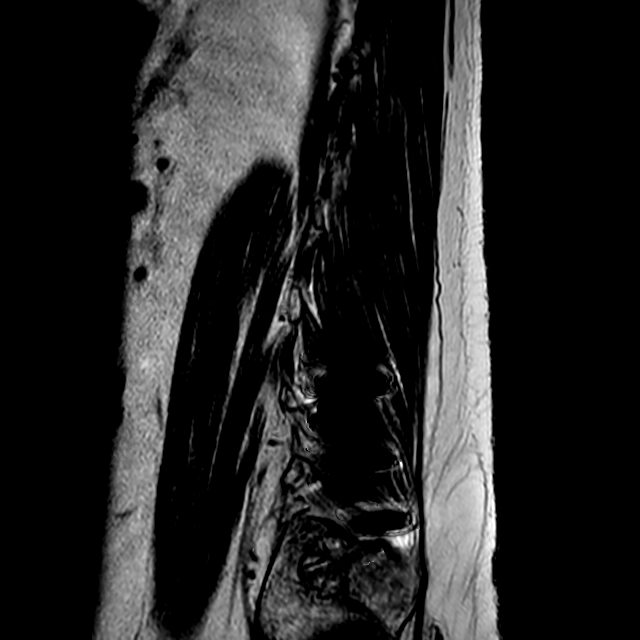
[im 10/19]
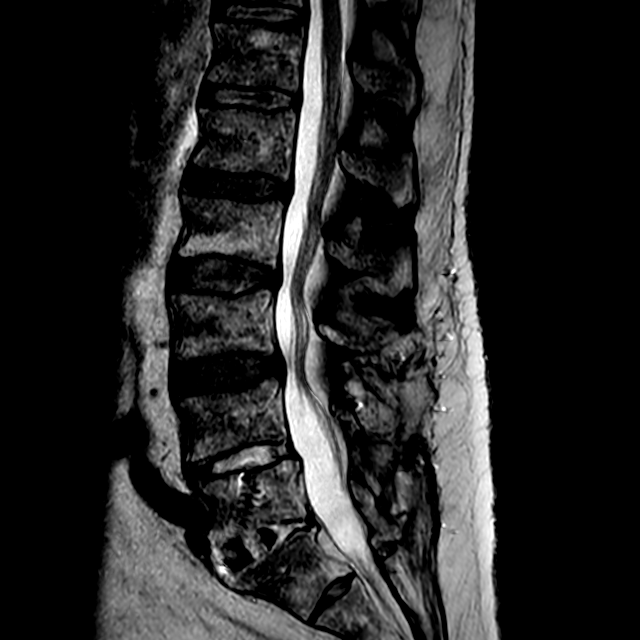
[im 19/19]
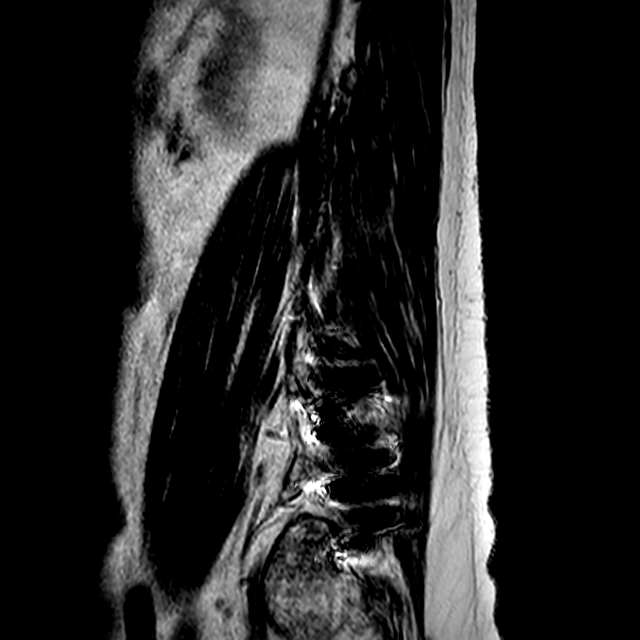

[Series 502: (id) view_ax mpr · axial · 1.0mm · 0.25mm/px · 1 of 111 slices shown]
[im 1/111]
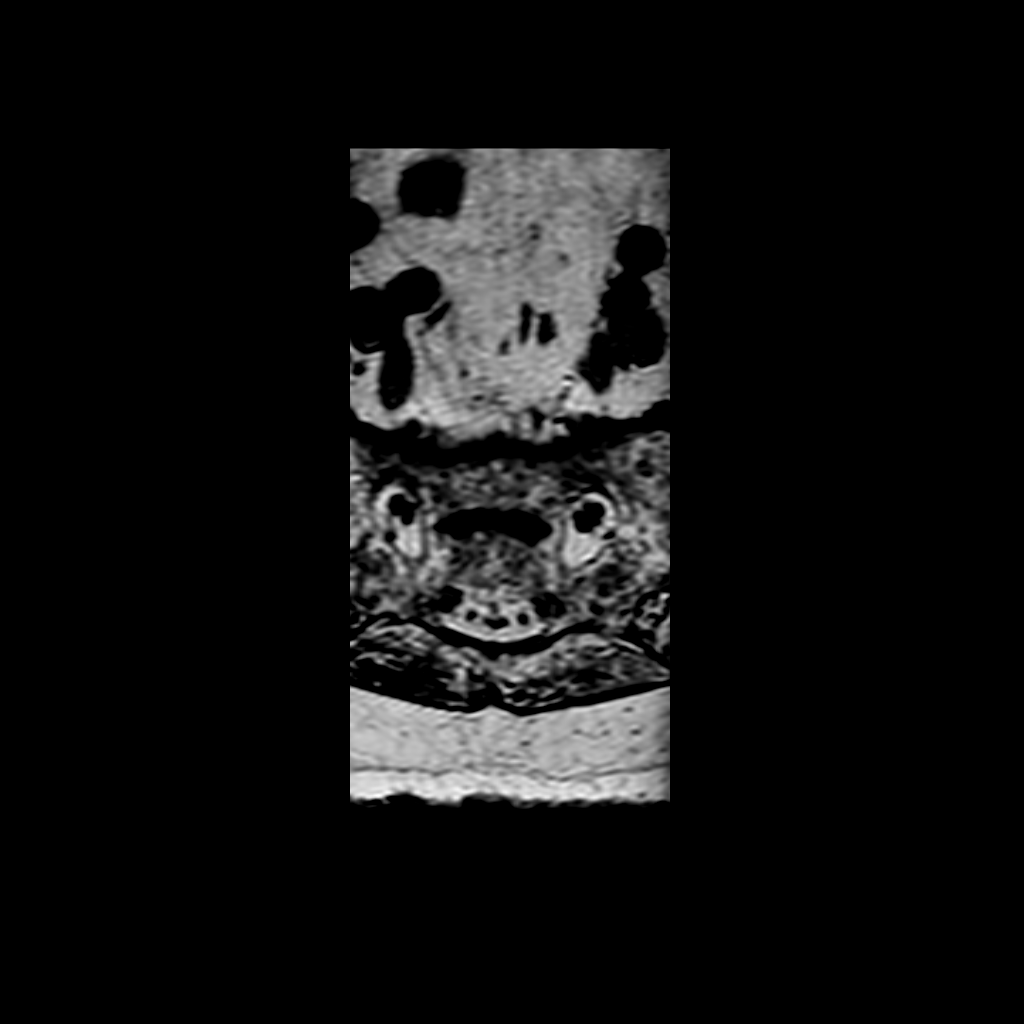

[13 of 48 positions shown; findings below may reference images not displayed]

FINDINGS: -------------------------------------------------------------------------------- 
------ 
GENERAL: 
There is transpedicular screw and rod fixation L4-S1 with interbody spacer and 
anterior screws L5-S1 with associated metallic susceptibility artifact.. Mild 
chronic wedge deformity of L2. No acute vertebral body fracture. No 
spondylolisthesis. Normal aortic diameter. Paraspinal musculature is symmetric. 
Modic I-II: L2-L3 
Ligamentum Flavum > 2.5 mm: All levels. 
-------------------------------------------------------------------------------- 
------ 
SEGMENTAL: 
T12-L1: Normal disc height and signal. No herniation. Normal facets. No spinal 
canal or neural foraminal stenosis. 
L1-L2: Normal disc height and signal. No herniation. Normal facets. No spinal 
canal or neural foraminal stenosis. 
L2-L3: Normal disc height and signal. No herniation. Mild facet hypertrophy. No 
spinal canal or neural foraminal stenosis. 
L3-L4: Normal disc height and signal. No herniation. Moderate facet hypertrophy. 
Mild bilateral neural foraminal stenoses. Mild prominence of the dorsal epidural 
fat. Mild spinal canal stenosis with the thecal sac narrowed to 8 mm AP. 
L4-L5: Postsurgical changes. Mild disc height loss. Mild facet hypertrophy. No 
spinal canal or neural foraminal stenosis. 
L5-S1: Postsurgical changes. No herniation. Normal facets. No spinal canal or 
neural foraminal stenosis. 
-------------------------------------------------------------------------------- 
------
IMPRESSION: 1.  Postsurgical changes with transpedicular screw and rod fixation L4-S1. 
2.  At L3-L4, there are mild spinal canal and bilateral neural foraminal 
stenoses.

## 2021-12-18 IMAGING — DX LUMBAR SPINE AP, LAT WITH FLEXION AND EXTEN
1 series · 4 of 4 positions shown · non-contrast
Comparison: MR exam the same day.

________________________________________________________________________________________________ 
LUMBAR SPINE AP, LAT WITH FLEXION AND EXTEN, 12/18/2021 [DATE]: 
CLINICAL INDICATION: Low Back Pain, Unspecified.

[Series 1: AP · 0.14mm/px · 4 of 4 slices shown]
[im 1/4]
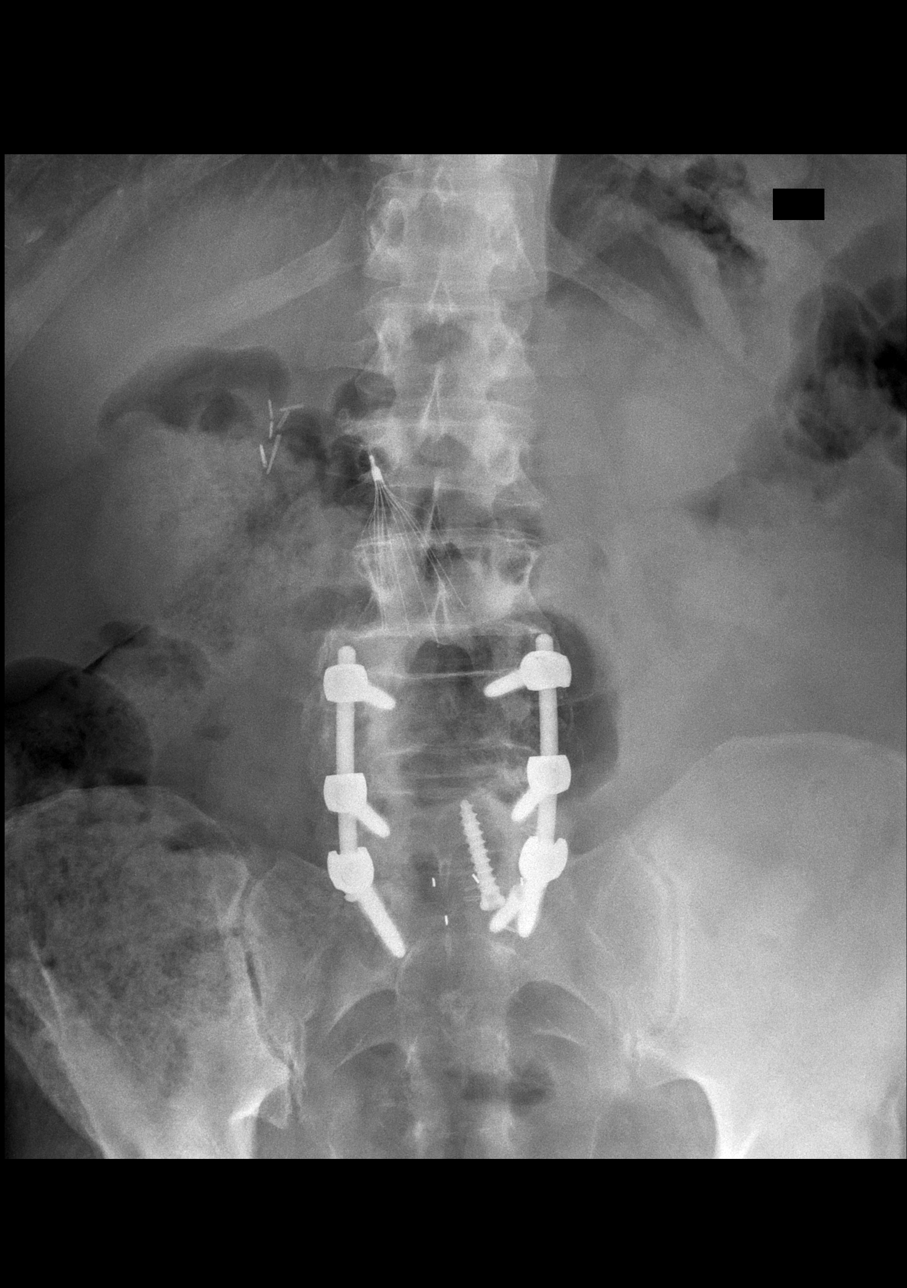
[im 2/4]
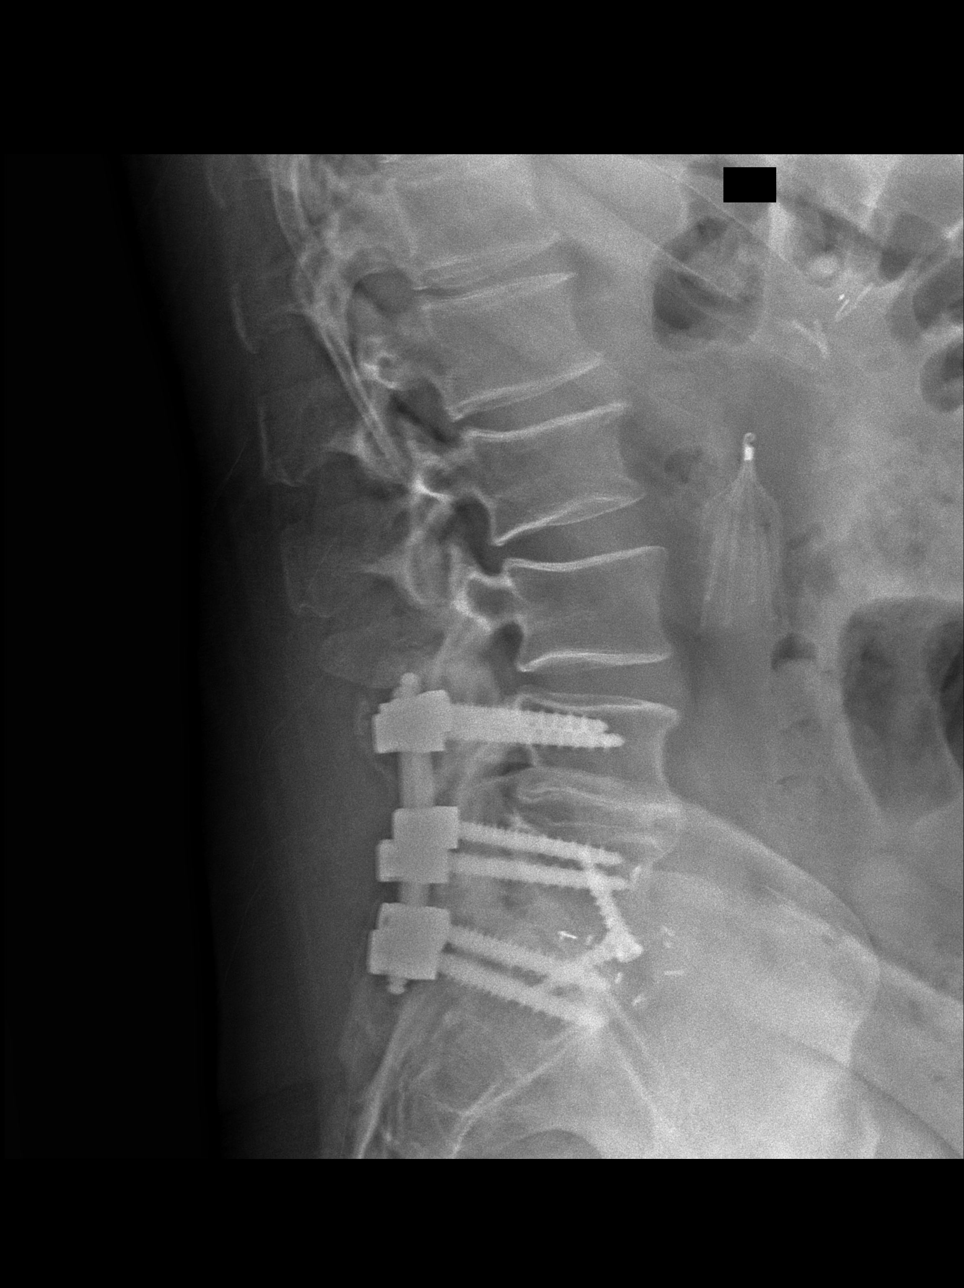
[im 3/4]
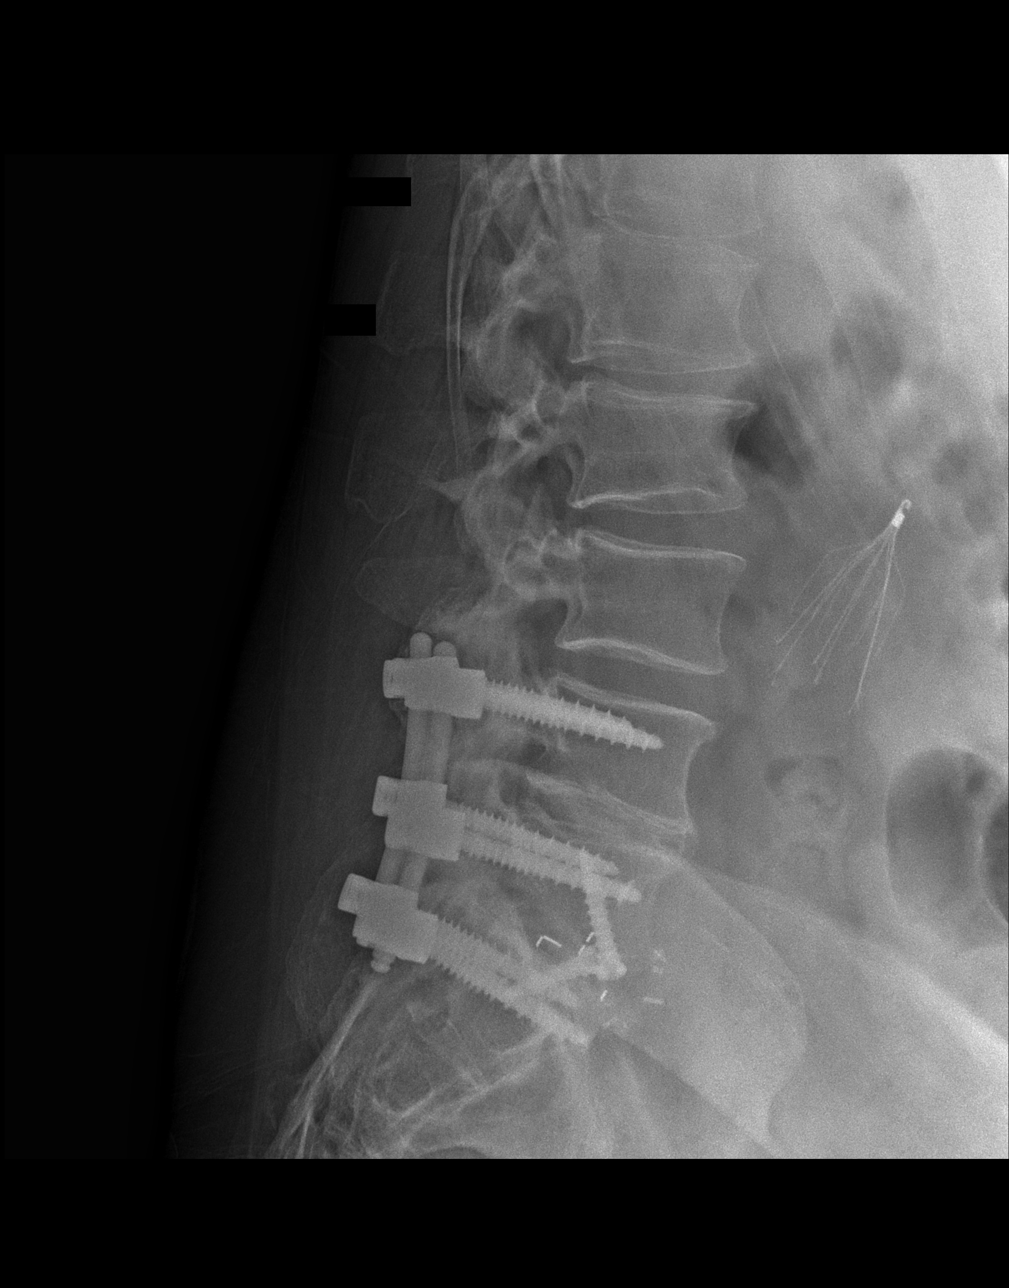
[im 4/4]
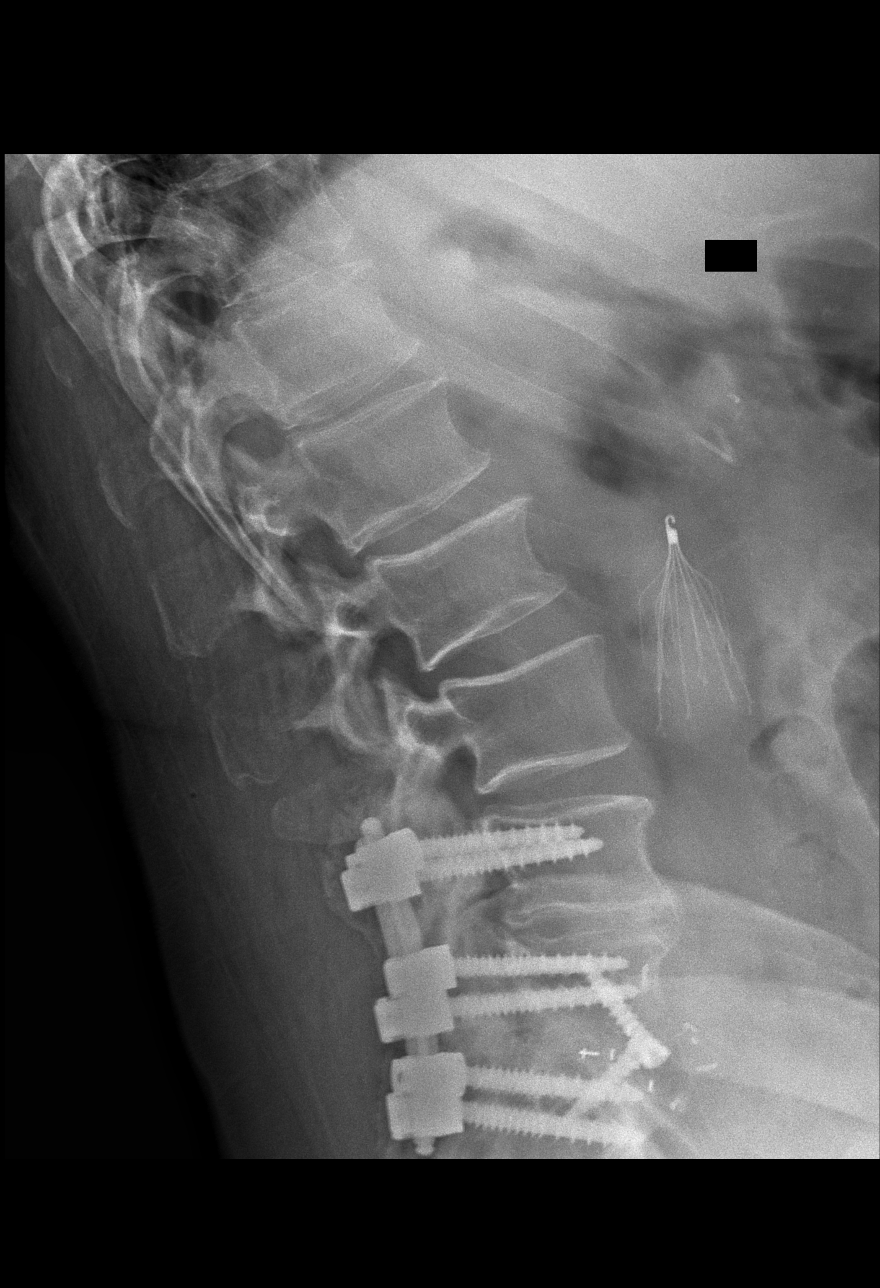

[4 of 4 positions shown; findings below may reference images not displayed]

FINDINGS: There is transpedicular screw and rod fixation L4-S1 with interbody 
spacer and anterior screws L5-S1. Hardware is intact. There is physiologic 
alignment. Mild chronic wedge deformity of L2. No acute vertebral body fracture. 
No spondylolisthesis. No subluxation on flexion or extension positioning. Disc 
heights appear preserved. Mild lower lumbar facet hypertrophy. IVC filter. 
Surgical clips right upper quadrant. Mild degenerative changes SI joints.
IMPRESSION: 1.  Postsurgical and mild spondylotic changes. 
2.  No subluxation with patient maneuvers.

## 2021-12-18 IMAGING — DX CERVICAL SPINE 4 VIEWS
1 series · 4 of 4 positions shown · non-contrast
Comparison: MR cervical spine same day, Plain film series 10/02/2021

________________________________________________________________________________________________ 
CERVICAL SPINE 4 VIEWS, 12/18/2021 [DATE]: 
CLINICAL INDICATION: Cervicalgia, chronic neck pain radiating down the RIGHT 
arm, no new injury, cervical fusion [id number]

[Series 1: lateral · 0.14mm/px · 4 of 4 slices shown]
[im 1/4]
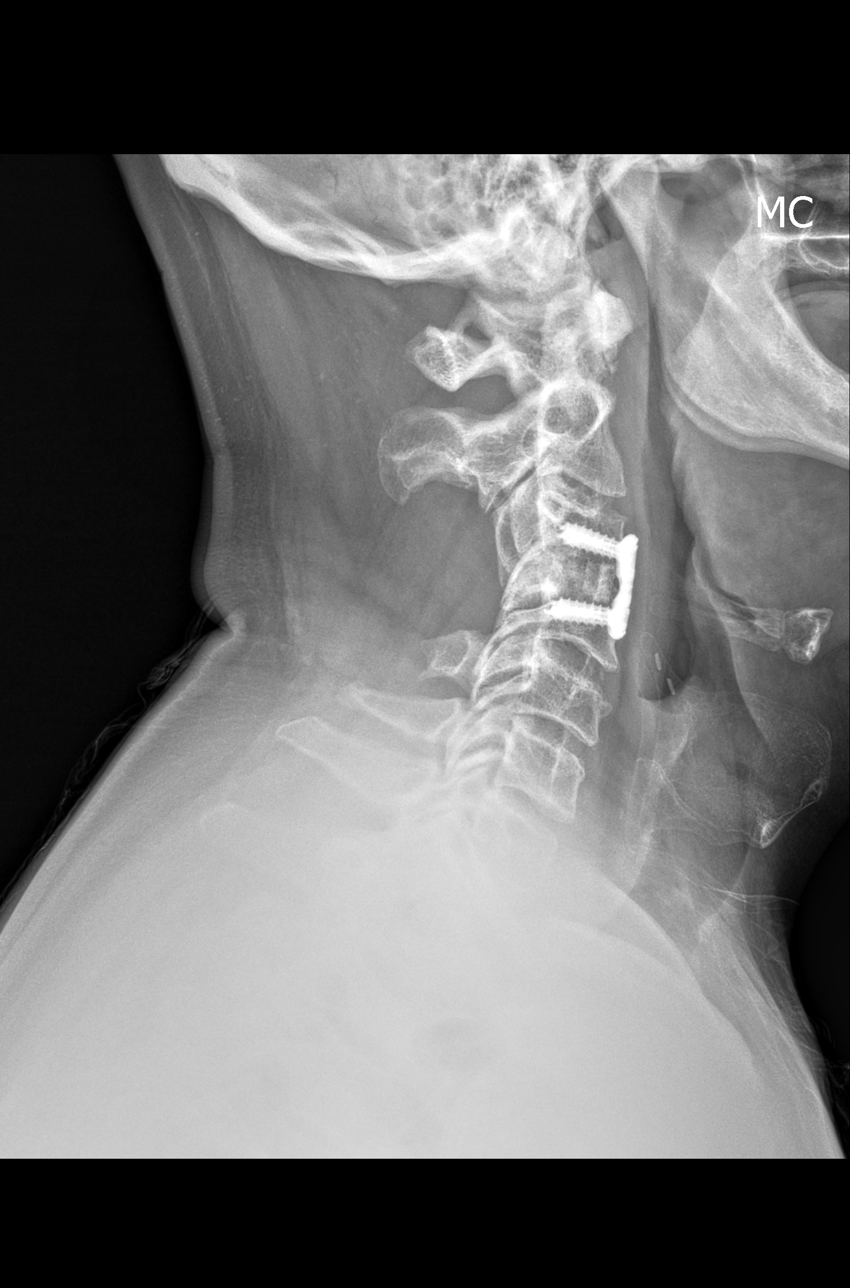
[im 2/4]
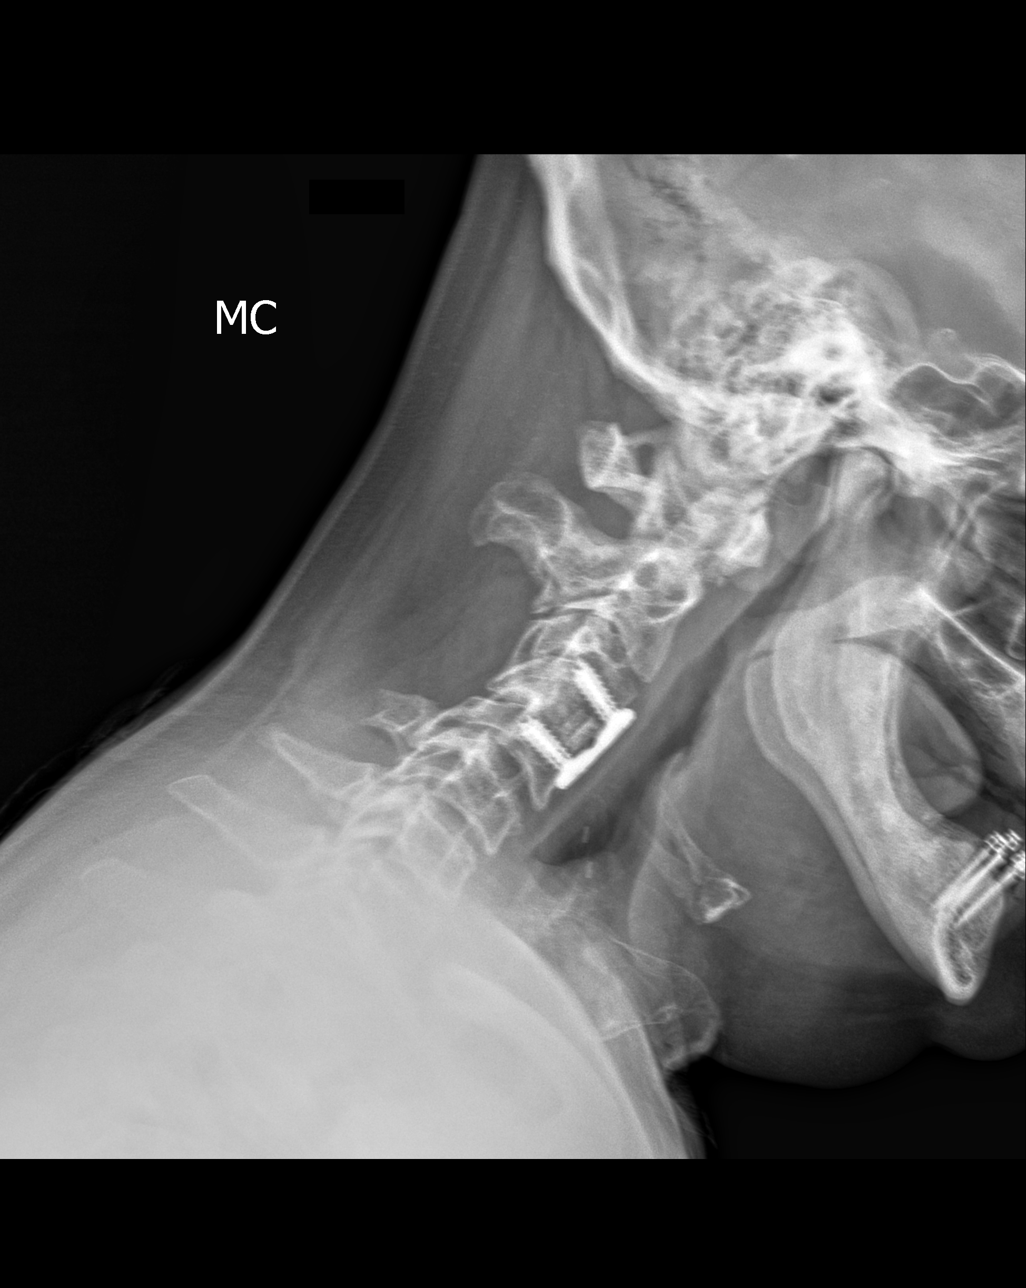
[im 3/4]
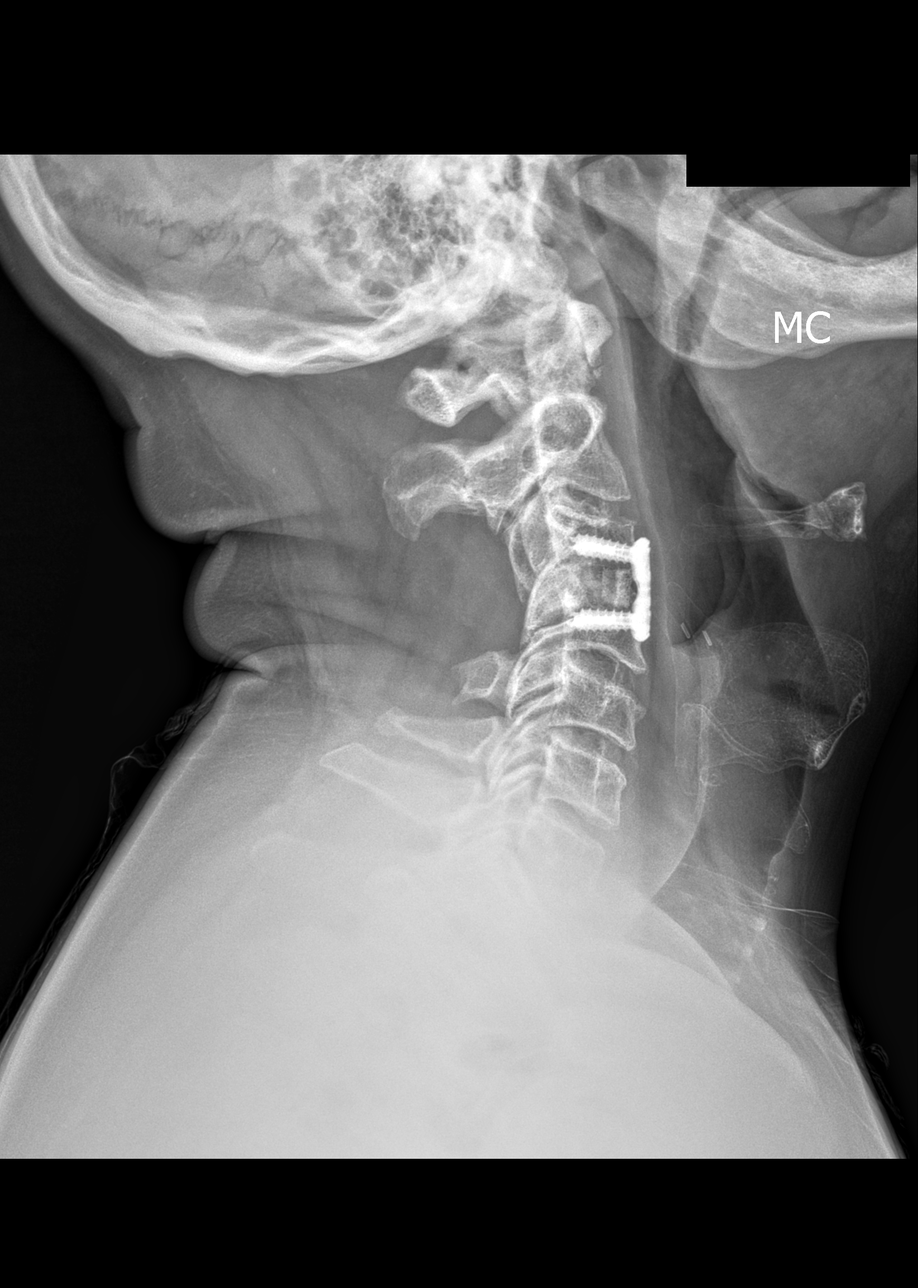
[im 4/4]
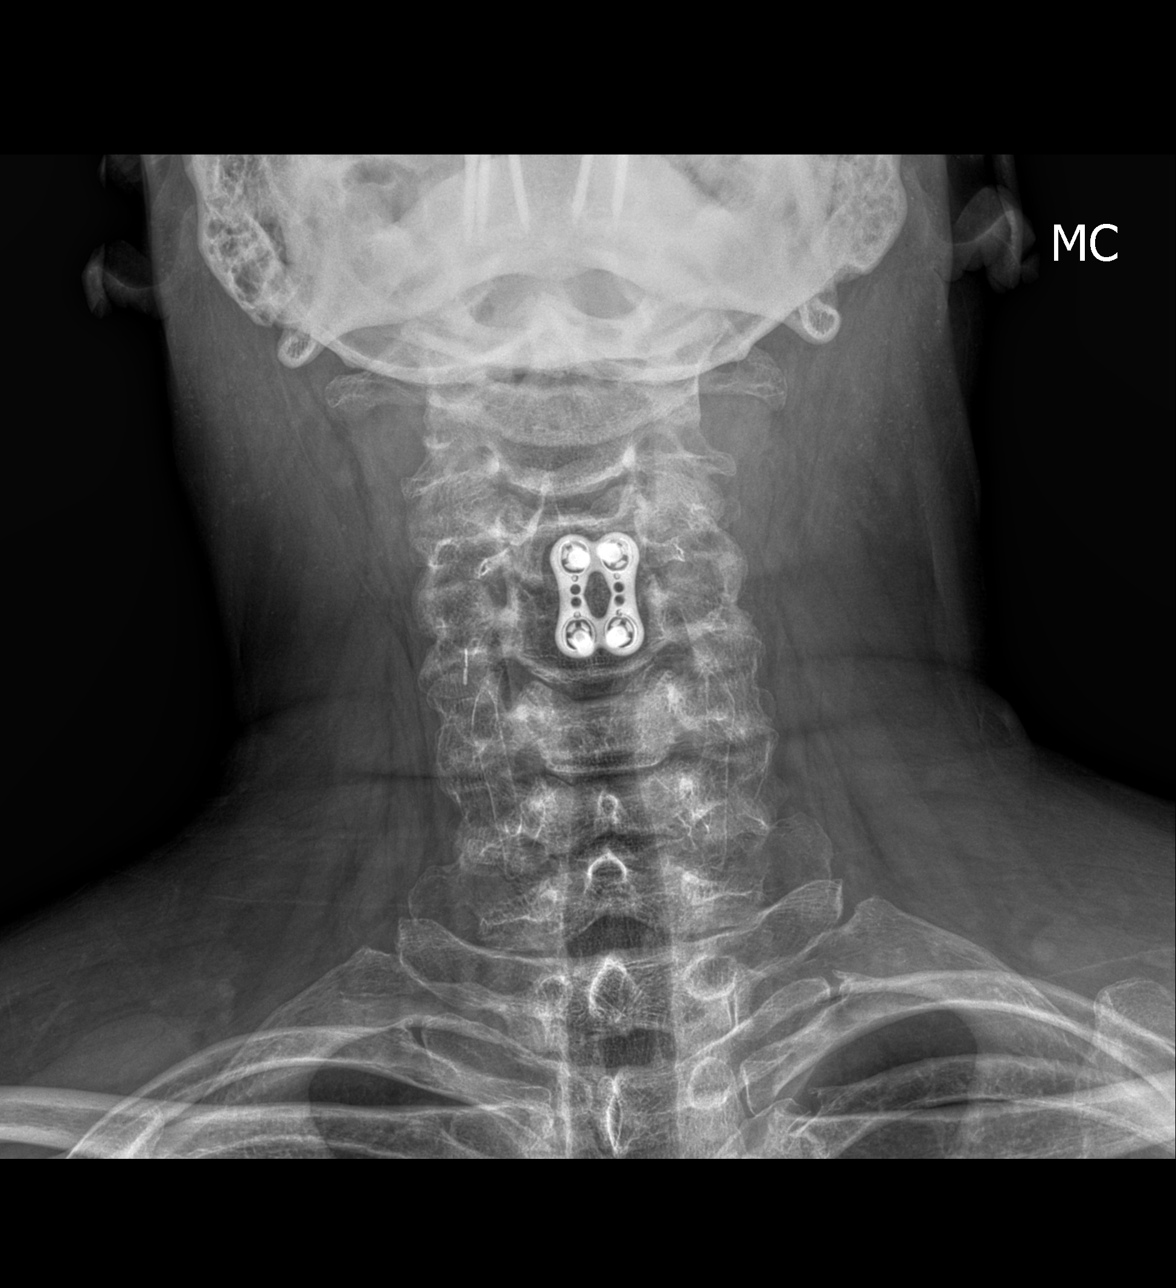

[4 of 4 positions shown; findings below may reference images not displayed]

FINDINGS: Previous C3/C4 anterior cervical plating with intervertebral disc 
spacer and posterior decompression. Radiolucency screws at both levels seen on 
the lateral projection only. Chronic 20% C5 wedge deformity. Truncated C5 
spinous process. Anterior C1/C2 degeneration. 
Flexion and extension imaging demonstrates normal alignment in 
extension/neutral, developing 0.3 cm anterolisthesis in flexion. 
Imaged lung apices are clear. Dental implants. Edentulous otherwise.
IMPRESSION: Lucency around the screws at C3/C4 plate suggestive of hardware loosening. 
Recommend CT cervical spine to better evaluate hardware, as clinically 
indicated. 
0.3 cm anterolisthesis C4/C5 in flexion, reduces in neutral/extension. Dynamic 
instability.  
Stable 20% C5 wedge deformity. 
Posterior decompression C3 and C4. 
MR cervical will be reported separately. 
Findings will be called by the radiology assistant to the referring clinicians 
office.

## 2021-12-18 IMAGING — MR MRI CERVICAL SPINE WITHOUT CONTRAST
6 of 9 series · 12 of 48 positions shown · IV contrast (gadolinium)
Comparison: Cervical radiograph October 02, 2021

________________________________________________________________________________________________ 
MRI CERVICAL SPINE WITHOUT CONTRAST, 12/18/2021 [DATE]: 
CLINICAL INDICATION: Chronic neck pain
TECHNIQUE: Sagittal T1, Sagittal T2, Sagittal STIR, Axial TSE and Axial [id number] 
images of the cervical spine were performed without intravenous gadolinium 
enhancement.

[Series 201: survey · axial · 10.0mm · 1.25mm/px · 1 of 10 slices shown]
[im 1/10]
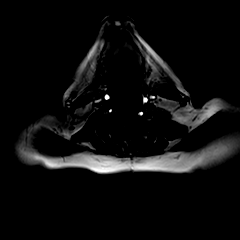

[Series 301: t2w_cor-surv · coronal · 5.0mm · 0.69mm/px · 1 of 7 slices shown]
[im 1/7]
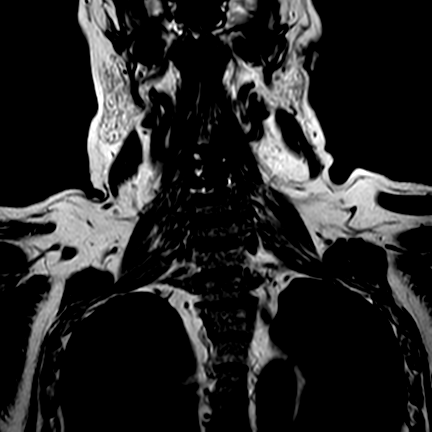

[Series 401: t1_sag · sagittal · 3.0mm · 0.33mm/px · 2 of 19 slices shown]
[im 1/19]
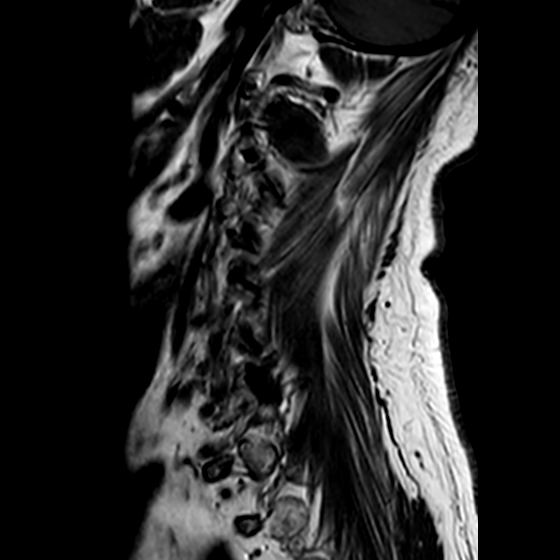
[im 19/19]
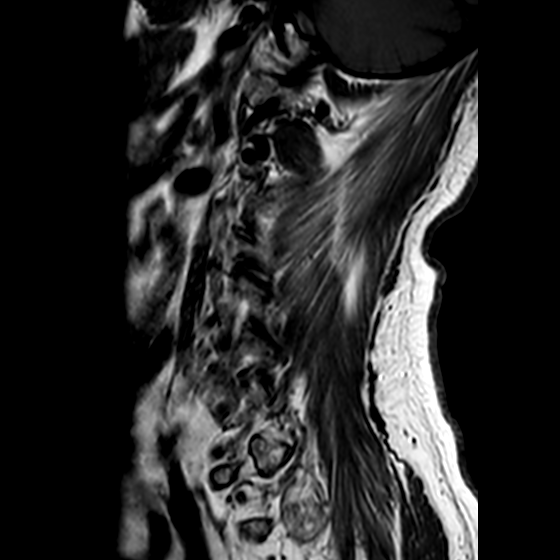

[Series 502: (id)_mdixon_tse · sagittal · 3.0mm · 0.35mm/px · 2 of 19 slices shown]
[im 1/19]
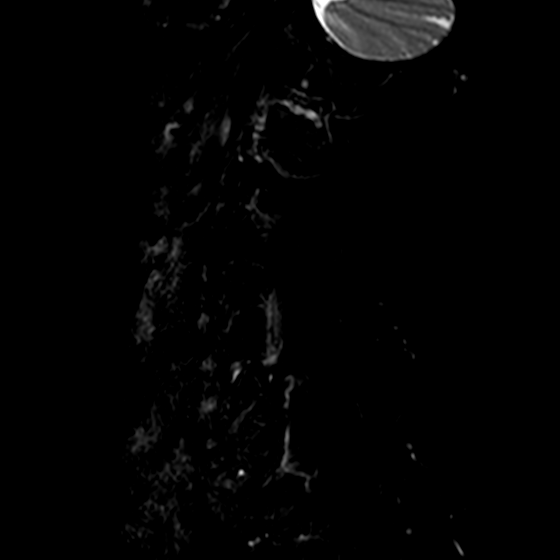
[im 19/19]
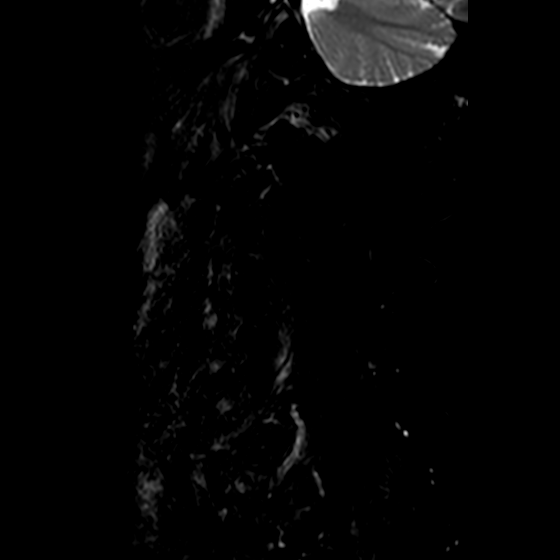

[Series 503: st2w_mdixon_tse · sagittal · 3.0mm · 0.35mm/px · 2 of 19 slices shown]
[im 1/19]
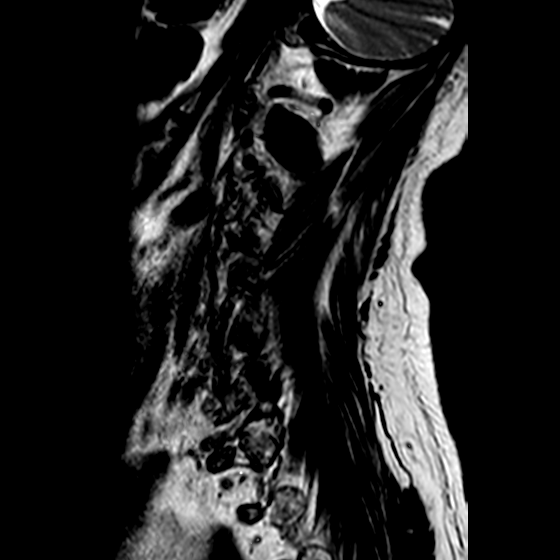
[im 19/19]
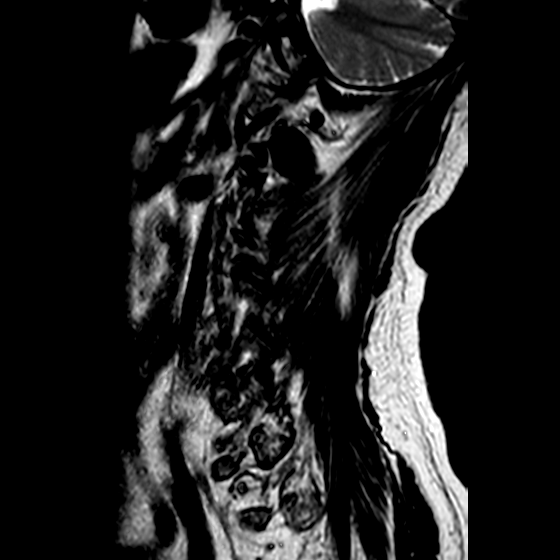

[Series 701: t2_(person_name)_(person_name) · axial · 3.0mm · 0.29mm/px · z∈[-243,-135]mm · 4 of 36 slices shown]
[im 1/36]
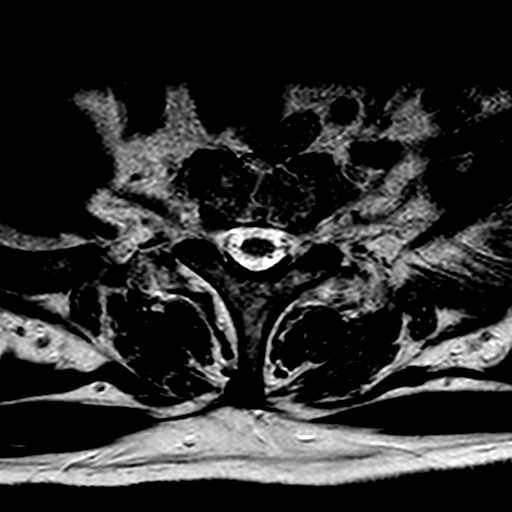
[im 12/36]
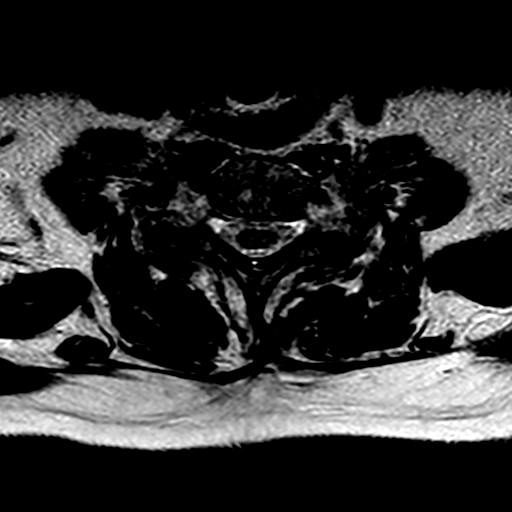
[im 24/36]
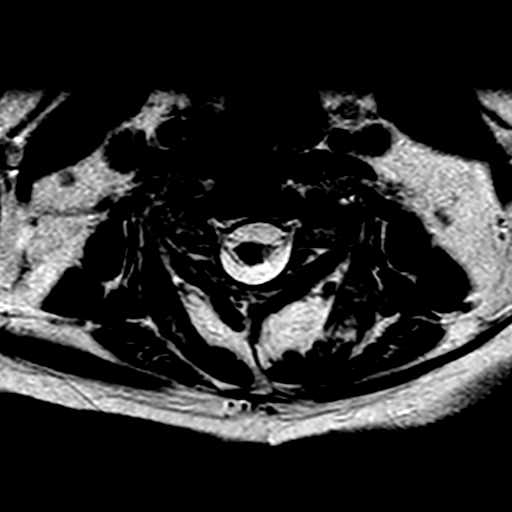
[im 36/36]
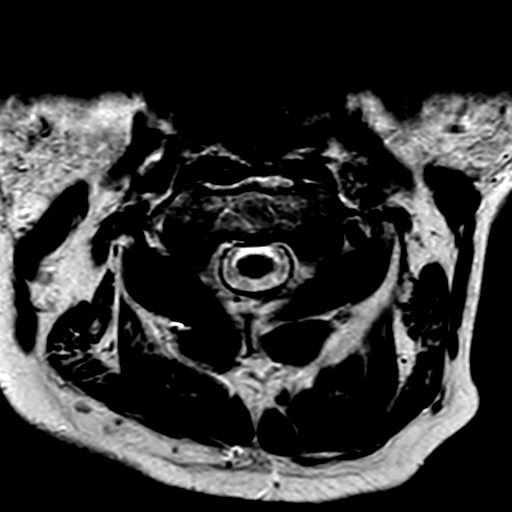

[12 of 48 positions shown; findings below may reference images not displayed]

FINDINGS: Patient is status post C3-4 ACDF with anterior plate and screw fusion. 
There has been posterior decompression at this level. There is mild thinning of 
the cord at this level. There is increased T2 signal in the left lateral cord, 
measuring 2 mm in AP dimension, vertical extent 12 mm as seen on sagittal 1 mm 
image 38... This most likely represents myelomalacic change. 
There is minimal disc-osteophyte at C2-3 not touching the cord. There is 
borderline canal stenosis. Mild to moderate left foraminal stenosis. 
At C3-4 the canal and foramina are open. 
At C4-5 there is moderate right foraminal stenosis, with the canal and foramina 
are open. 
At C5-6 there is mild disc bulge not touching the cord. There is mild canal 
stenosis, and mild to moderate right foraminal stenosis, left foramen open. 
At C6-7 the canal is borderline narrow. Foramina are open. 
At C7-T1 the canal and foramina are open.  
Mild Modic type I change along the anterior C2-3 interspace. 
The dens is intact. The craniocervical junction is open.
IMPRESSION: Status post C3-4 fusion with decompression. There is mild thinning of the cord 
at this level, with 12 by 2 mm T2 hyperintense signal in the left lateral cord 
consistent with myelomalacic change. 
Borderline to mild canal stenosis elsewhere as described. There is moderate 
right foraminal stenosis at C4-5, mild to moderate left foraminal stenosis at 
C2-3, on the right at C5-6. 
No evidence for fracture or malignancy. There is mild Modic type I change along 
the anterior C2-3 interspace.

## 2022-04-07 DEATH — deceased
